# Patient Record
Sex: Male | Born: 1935 | Race: White | Hispanic: No | Marital: Married | Smoking: Never smoker
Health system: Southern US, Community
[De-identification: ages and names within clinical notes are randomized; demographics above are authoritative.]

## PROBLEM LIST (undated history)

## (undated) DIAGNOSIS — R519 Headache, unspecified: Secondary | ICD-10-CM

## (undated) DIAGNOSIS — D1803 Hemangioma of intra-abdominal structures: Secondary | ICD-10-CM

## (undated) DIAGNOSIS — M1990 Unspecified osteoarthritis, unspecified site: Secondary | ICD-10-CM

## (undated) DIAGNOSIS — Z889 Allergy status to unspecified drugs, medicaments and biological substances status: Secondary | ICD-10-CM

## (undated) DIAGNOSIS — M545 Low back pain, unspecified: Secondary | ICD-10-CM

## (undated) DIAGNOSIS — K5790 Diverticulosis of intestine, part unspecified, without perforation or abscess without bleeding: Secondary | ICD-10-CM

## (undated) DIAGNOSIS — E785 Hyperlipidemia, unspecified: Secondary | ICD-10-CM

## (undated) DIAGNOSIS — I10 Essential (primary) hypertension: Secondary | ICD-10-CM

## (undated) DIAGNOSIS — R51 Headache: Secondary | ICD-10-CM

## (undated) HISTORY — PX: COLONOSCOPY: SHX174

---

## 2010-04-30 ENCOUNTER — Encounter: Admission: RE | Admit: 2010-04-30 | Discharge: 2010-04-30 | Payer: Self-pay | Admitting: Internal Medicine

## 2010-08-10 ENCOUNTER — Other Ambulatory Visit: Payer: Self-pay | Admitting: Internal Medicine

## 2010-08-10 DIAGNOSIS — K769 Liver disease, unspecified: Secondary | ICD-10-CM

## 2010-09-13 ENCOUNTER — Ambulatory Visit
Admission: RE | Admit: 2010-09-13 | Discharge: 2010-09-13 | Disposition: A | Discharge status: Home / Self Care | Payer: Medicare Other | Source: Ambulatory Visit | Attending: Internal Medicine | Admitting: Internal Medicine

## 2010-09-13 DIAGNOSIS — K769 Liver disease, unspecified: Secondary | ICD-10-CM

## 2010-09-13 MED ORDER — GADOBENATE DIMEGLUMINE 529 MG/ML IV SOLN
16.0000 mL | Freq: Once | INTRAVENOUS | Status: AC | PRN
  Administered 2010-09-13: 16 mL via INTRAVENOUS

## 2011-08-30 DIAGNOSIS — I10 Essential (primary) hypertension: Secondary | ICD-10-CM | POA: Diagnosis not present

## 2011-08-30 DIAGNOSIS — IMO0001 Reserved for inherently not codable concepts without codable children: Secondary | ICD-10-CM | POA: Diagnosis not present

## 2011-08-30 DIAGNOSIS — R109 Unspecified abdominal pain: Secondary | ICD-10-CM | POA: Diagnosis not present

## 2011-08-30 DIAGNOSIS — M159 Polyosteoarthritis, unspecified: Secondary | ICD-10-CM | POA: Diagnosis not present

## 2011-09-13 DIAGNOSIS — M159 Polyosteoarthritis, unspecified: Secondary | ICD-10-CM | POA: Diagnosis not present

## 2011-09-13 DIAGNOSIS — I10 Essential (primary) hypertension: Secondary | ICD-10-CM | POA: Diagnosis not present

## 2011-09-13 DIAGNOSIS — R109 Unspecified abdominal pain: Secondary | ICD-10-CM | POA: Diagnosis not present

## 2011-10-18 DIAGNOSIS — H2510 Age-related nuclear cataract, unspecified eye: Secondary | ICD-10-CM | POA: Diagnosis not present

## 2011-10-18 DIAGNOSIS — H52209 Unspecified astigmatism, unspecified eye: Secondary | ICD-10-CM | POA: Diagnosis not present

## 2012-01-11 DIAGNOSIS — R1031 Right lower quadrant pain: Secondary | ICD-10-CM | POA: Diagnosis not present

## 2012-01-11 DIAGNOSIS — R1011 Right upper quadrant pain: Secondary | ICD-10-CM | POA: Diagnosis not present

## 2013-08-06 DIAGNOSIS — N139 Obstructive and reflux uropathy, unspecified: Secondary | ICD-10-CM | POA: Diagnosis not present

## 2013-08-06 DIAGNOSIS — N401 Enlarged prostate with lower urinary tract symptoms: Secondary | ICD-10-CM | POA: Diagnosis not present

## 2013-08-06 DIAGNOSIS — R972 Elevated prostate specific antigen [PSA]: Secondary | ICD-10-CM | POA: Diagnosis not present

## 2013-08-13 DIAGNOSIS — R972 Elevated prostate specific antigen [PSA]: Secondary | ICD-10-CM | POA: Diagnosis not present

## 2013-08-13 DIAGNOSIS — N139 Obstructive and reflux uropathy, unspecified: Secondary | ICD-10-CM | POA: Diagnosis not present

## 2013-08-13 DIAGNOSIS — N401 Enlarged prostate with lower urinary tract symptoms: Secondary | ICD-10-CM | POA: Diagnosis not present

## 2013-09-26 DIAGNOSIS — I10 Essential (primary) hypertension: Secondary | ICD-10-CM | POA: Diagnosis not present

## 2013-10-02 DIAGNOSIS — E782 Mixed hyperlipidemia: Secondary | ICD-10-CM | POA: Diagnosis not present

## 2013-10-02 DIAGNOSIS — I10 Essential (primary) hypertension: Secondary | ICD-10-CM | POA: Diagnosis not present

## 2013-12-03 DIAGNOSIS — H53009 Unspecified amblyopia, unspecified eye: Secondary | ICD-10-CM | POA: Diagnosis not present

## 2013-12-03 DIAGNOSIS — H2510 Age-related nuclear cataract, unspecified eye: Secondary | ICD-10-CM | POA: Diagnosis not present

## 2013-12-03 DIAGNOSIS — H52209 Unspecified astigmatism, unspecified eye: Secondary | ICD-10-CM | POA: Diagnosis not present

## 2013-12-26 DIAGNOSIS — M549 Dorsalgia, unspecified: Secondary | ICD-10-CM | POA: Diagnosis not present

## 2013-12-26 DIAGNOSIS — I10 Essential (primary) hypertension: Secondary | ICD-10-CM | POA: Diagnosis not present

## 2013-12-26 DIAGNOSIS — E782 Mixed hyperlipidemia: Secondary | ICD-10-CM | POA: Diagnosis not present

## 2013-12-26 DIAGNOSIS — IMO0002 Reserved for concepts with insufficient information to code with codable children: Secondary | ICD-10-CM | POA: Diagnosis not present

## 2014-05-05 DIAGNOSIS — Z23 Encounter for immunization: Secondary | ICD-10-CM | POA: Diagnosis not present

## 2014-06-19 DIAGNOSIS — R198 Other specified symptoms and signs involving the digestive system and abdomen: Secondary | ICD-10-CM | POA: Diagnosis not present

## 2014-06-19 DIAGNOSIS — R1011 Right upper quadrant pain: Secondary | ICD-10-CM | POA: Diagnosis not present

## 2014-06-19 DIAGNOSIS — K5730 Diverticulosis of large intestine without perforation or abscess without bleeding: Secondary | ICD-10-CM | POA: Diagnosis not present

## 2014-07-21 ENCOUNTER — Other Ambulatory Visit: Payer: Self-pay | Admitting: Gastroenterology

## 2014-07-21 DIAGNOSIS — R141 Gas pain: Secondary | ICD-10-CM | POA: Diagnosis not present

## 2014-07-21 DIAGNOSIS — R1011 Right upper quadrant pain: Secondary | ICD-10-CM

## 2014-07-21 DIAGNOSIS — K5900 Constipation, unspecified: Secondary | ICD-10-CM | POA: Diagnosis not present

## 2014-07-23 DIAGNOSIS — I10 Essential (primary) hypertension: Secondary | ICD-10-CM | POA: Diagnosis not present

## 2014-07-23 DIAGNOSIS — E161 Other hypoglycemia: Secondary | ICD-10-CM | POA: Diagnosis not present

## 2014-07-23 DIAGNOSIS — Z1389 Encounter for screening for other disorder: Secondary | ICD-10-CM | POA: Diagnosis not present

## 2014-07-23 DIAGNOSIS — Z0000 Encounter for general adult medical examination without abnormal findings: Secondary | ICD-10-CM | POA: Diagnosis not present

## 2014-07-30 DIAGNOSIS — M545 Low back pain: Secondary | ICD-10-CM | POA: Diagnosis not present

## 2014-07-30 DIAGNOSIS — E782 Mixed hyperlipidemia: Secondary | ICD-10-CM | POA: Diagnosis not present

## 2014-07-30 DIAGNOSIS — M150 Primary generalized (osteo)arthritis: Secondary | ICD-10-CM | POA: Diagnosis not present

## 2014-07-30 DIAGNOSIS — I10 Essential (primary) hypertension: Secondary | ICD-10-CM | POA: Diagnosis not present

## 2014-08-04 ENCOUNTER — Encounter (HOSPITAL_COMMUNITY)
Admission: RE | Admit: 2014-08-04 | Discharge: 2014-08-04 | Disposition: A | Discharge status: Home / Self Care | Payer: Medicare Other | Source: Ambulatory Visit | Attending: Gastroenterology | Admitting: Gastroenterology

## 2014-08-04 ENCOUNTER — Ambulatory Visit (HOSPITAL_COMMUNITY)
Admission: RE | Admit: 2014-08-04 | Discharge: 2014-08-04 | Disposition: A | Discharge status: Home / Self Care | Payer: Medicare Other | Source: Ambulatory Visit | Attending: Gastroenterology | Admitting: Gastroenterology

## 2014-08-04 DIAGNOSIS — D1803 Hemangioma of intra-abdominal structures: Secondary | ICD-10-CM | POA: Diagnosis not present

## 2014-08-04 DIAGNOSIS — R1011 Right upper quadrant pain: Secondary | ICD-10-CM | POA: Diagnosis not present

## 2014-08-04 DIAGNOSIS — K828 Other specified diseases of gallbladder: Secondary | ICD-10-CM | POA: Diagnosis not present

## 2014-08-04 MED ORDER — SINCALIDE 5 MCG IJ SOLR
INTRAMUSCULAR | Status: AC
  Filled 2014-08-04: qty 5

## 2014-08-04 MED ORDER — TECHNETIUM TC 99M MEBROFENIN IV KIT
5.0000 | PACK | Freq: Once | INTRAVENOUS | Status: AC | PRN
  Administered 2014-08-04: 5 via INTRAVENOUS

## 2014-08-04 MED ORDER — STERILE WATER FOR INJECTION IJ SOLN
INTRAMUSCULAR | Status: AC
  Filled 2014-08-04: qty 10

## 2014-08-04 MED ORDER — SINCALIDE 5 MCG IJ SOLR
0.0200 ug/kg | Freq: Once | INTRAMUSCULAR | Status: AC
  Administered 2014-08-04: 1.6 ug via INTRAVENOUS

## 2014-08-18 DIAGNOSIS — K8020 Calculus of gallbladder without cholecystitis without obstruction: Secondary | ICD-10-CM | POA: Diagnosis not present

## 2014-08-18 DIAGNOSIS — R140 Abdominal distension (gaseous): Secondary | ICD-10-CM | POA: Diagnosis not present

## 2014-08-21 DIAGNOSIS — K8050 Calculus of bile duct without cholangitis or cholecystitis without obstruction: Secondary | ICD-10-CM | POA: Diagnosis not present

## 2014-10-09 NOTE — Progress Notes (Signed)
ekg 07-30-14 dr Carmie End on chart

## 2014-10-09 NOTE — Progress Notes (Signed)
Please put orders in Epic surgery 10-13-14 pre op 10-10-14 Thanks

## 2014-10-09 NOTE — Patient Instructions (Addendum)
Torion Hulgan  10/09/2014   Your procedure is scheduled on: Monday October 13, 2014  Report to [facility] Main  Entrance and follow signs to               [place] at 1130 AM.  Call this number if you have problems the morning of surgery [phone]   Remember:  Do not eat food  :After Midnight Sunday night, may have clear liquids from mdfnight Sunday night until  730 am Monday morning, nothing by mouth after 730 am Monday morning.   Take these medicines the morning of surgery with A SIP OF WATER: none                               You may not have any metal on your body including hair pins and              piercings  Do not wear jewelry, make-up, lotions, powders or perfumes.             Do not wear nail polish.  Do not shave  48 hours prior to surgery.              Men may shave face and neck.   Do not bring valuables to the hospital. [place].  Contacts, dentures or bridgework may not be worn into surgery.  Leave suitcase in the car. After surgery it may be brought to your room.     Patients discharged the day of surgery will not be allowed to drive home.  Name and phone number of your driver: wife SUSAN  Special Instructions: N/A              Please read over the following fact sheets you were given: _____________________________________________________________________             [facility] - Preparing for Surgery Before surgery, you can play an important role.  Because skin is not sterile, your skin needs to be as free of germs as possible.  You can reduce the number of germs on your skin by washing with CHG (chlorahexidine gluconate) soap before surgery.  CHG is an antiseptic cleaner which kills germs and bonds with the skin to continue killing germs even after washing. Please DO NOT use if you have an allergy to CHG or antibacterial soaps.  If your skin becomes reddened/irritated stop using  the CHG and inform your nurse when you arrive at Short Stay. Do not shave (including legs and underarms) for at least 48 hours prior to the first CHG shower.  You may shave your face/neck. Please follow these instructions carefully:  1.  Shower with CHG Soap the night before surgery and the  morning of Surgery.  2.  If you choose to wash your hair, wash your hair first as usual with your  normal  shampoo.  3.  After you shampoo, rinse your hair and body thoroughly to remove the  shampoo.                           4.  Use CHG as you would any other liquid soap.  You can apply chg directly  to the skin and wash  Gently with a scrungie or clean washcloth.  5.  Apply the CHG Soap to your body ONLY FROM THE NECK DOWN.   Do not use on face/ open                           Wound or open sores. Avoid contact with eyes, ears mouth and genitals (private parts).                       Wash face,  Genitals (private parts) with your normal soap.             6.  Wash thoroughly, paying special attention to the area where your surgery  will be performed.  7.  Thoroughly rinse your body with warm water from the neck down.  8.  DO NOT shower/wash with your normal soap after using and rinsing off  the CHG Soap.                9.  Pat yourself dry with a clean towel.            10.  Wear clean pajamas.            11.  Place clean sheets on your bed the night of your first shower and do not  sleep with pets. Day of Surgery : Do not apply any lotions/deodorants the morning of surgery.  Please wear clean clothes to the hospital/surgery center.  FAILURE TO FOLLOW THESE INSTRUCTIONS MAY RESULT IN THE CANCELLATION OF YOUR SURGERY PATIENT SIGNATURE_________________________________  NURSE SIGNATURE__________________________________  ________________________________________________________________________    CLEAR LIQUID DIET   Foods Allowed                                                                      Foods Excluded  Coffee and tea, regular and decaf                             liquids that you cannot  Plain Jell-O in any flavor                                             see through such as: Fruit ices (not with fruit pulp)                                     milk, soups, orange juice  Iced Popsicles                                    All solid food Carbonated beverages, regular and diet                                    Cranberry, grape and apple juices Sports drinks like Gatorade Lightly seasoned clear broth or consume(fat free) Sugar, honey syrup  Sample Menu Breakfast                                Lunch                                     Supper Cranberry juice                    Beef broth                            Chicken broth Jell-O                                     Grape juice                           Apple juice Coffee or tea                        Jell-O                                      Popsicle                                                Coffee or tea                        Coffee or tea  _____________________________________________________________________

## 2014-10-10 ENCOUNTER — Encounter (HOSPITAL_COMMUNITY)
Admission: RE | Admit: 2014-10-10 | Discharge: 2014-10-10 | Disposition: A | Discharge status: Home / Self Care | Payer: Medicare Other | Source: Ambulatory Visit | Attending: General Surgery | Admitting: General Surgery

## 2014-10-10 ENCOUNTER — Encounter (HOSPITAL_COMMUNITY): Payer: Self-pay

## 2014-10-10 DIAGNOSIS — G43909 Migraine, unspecified, not intractable, without status migrainosus: Secondary | ICD-10-CM | POA: Diagnosis not present

## 2014-10-10 DIAGNOSIS — F1099 Alcohol use, unspecified with unspecified alcohol-induced disorder: Secondary | ICD-10-CM | POA: Diagnosis not present

## 2014-10-10 DIAGNOSIS — K811 Chronic cholecystitis: Secondary | ICD-10-CM | POA: Diagnosis not present

## 2014-10-10 DIAGNOSIS — R109 Unspecified abdominal pain: Secondary | ICD-10-CM | POA: Diagnosis present

## 2014-10-10 DIAGNOSIS — I10 Essential (primary) hypertension: Secondary | ICD-10-CM | POA: Diagnosis not present

## 2014-10-10 HISTORY — DX: Low back pain, unspecified: M54.50

## 2014-10-10 HISTORY — DX: Hyperlipidemia, unspecified: E78.5

## 2014-10-10 HISTORY — DX: Headache, unspecified: R51.9

## 2014-10-10 HISTORY — DX: Hemangioma of intra-abdominal structures: D18.03

## 2014-10-10 HISTORY — DX: Headache: R51

## 2014-10-10 HISTORY — DX: Low back pain: M54.5

## 2014-10-10 HISTORY — DX: Diverticulosis of intestine, part unspecified, without perforation or abscess without bleeding: K57.90

## 2014-10-10 HISTORY — DX: Unspecified osteoarthritis, unspecified site: M19.90

## 2014-10-10 HISTORY — DX: Essential (primary) hypertension: I10

## 2014-10-10 LAB — BASIC METABOLIC PANEL WITH GFR
Anion gap: 8 (ref 5–15)
BUN: 38 mg/dL — ABNORMAL HIGH (ref 6–23)
CO2: 27 mmol/L (ref 19–32)
Calcium: 9 mg/dL (ref 8.4–10.5)
Chloride: 105 mmol/L (ref 96–112)
Creatinine, Ser: 0.79 mg/dL (ref 0.50–1.35)
GFR calc Af Amer: >90 mL/min
GFR calc non Af Amer: 84 mL/min — ABNORMAL LOW
Glucose, Bld: 108 mg/dL — ABNORMAL HIGH (ref 70–99)
Potassium: 4.4 mmol/L (ref 3.5–5.1)
Sodium: 140 mmol/L (ref 135–145)

## 2014-10-10 LAB — CBC
HCT: 41.2 % (ref 39.0–52.0)
Hemoglobin: 13.3 g/dL (ref 13.0–17.0)
MCH: 31.7 pg (ref 26.0–34.0)
MCHC: 32.3 g/dL (ref 30.0–36.0)
MCV: 98.3 fL (ref 78.0–100.0)
Platelets: 194 K/uL (ref 150–400)
RBC: 4.19 MIL/uL — ABNORMAL LOW (ref 4.22–5.81)
RDW: 13 % (ref 11.5–15.5)
WBC: 5.3 K/uL (ref 4.0–10.5)

## 2014-10-10 NOTE — Progress Notes (Signed)
Faxed bun to dr Redmond Pulling via epic

## 2014-10-10 NOTE — Progress Notes (Signed)
lov dr Ashby Dawes 1/16 chart

## 2014-10-13 ENCOUNTER — Ambulatory Visit: Payer: Self-pay | Admitting: General Surgery

## 2014-10-13 ENCOUNTER — Ambulatory Visit (HOSPITAL_COMMUNITY): Payer: Medicare Other | Admitting: Anesthesiology

## 2014-10-13 ENCOUNTER — Ambulatory Visit (HOSPITAL_COMMUNITY)
Admission: RE | Admit: 2014-10-13 | Discharge: 2014-10-13 | Disposition: A | Discharge status: Home / Self Care | Payer: Medicare Other | Source: Ambulatory Visit | Attending: General Surgery | Admitting: General Surgery

## 2014-10-13 ENCOUNTER — Encounter (HOSPITAL_COMMUNITY): Payer: Self-pay

## 2014-10-13 ENCOUNTER — Ambulatory Visit (HOSPITAL_COMMUNITY): Payer: Medicare Other

## 2014-10-13 ENCOUNTER — Encounter (HOSPITAL_COMMUNITY)
Admission: RE | Disposition: A | Discharge status: Home / Self Care | Payer: Self-pay | Source: Ambulatory Visit | Attending: General Surgery

## 2014-10-13 DIAGNOSIS — K8020 Calculus of gallbladder without cholecystitis without obstruction: Secondary | ICD-10-CM

## 2014-10-13 DIAGNOSIS — F1099 Alcohol use, unspecified with unspecified alcohol-induced disorder: Secondary | ICD-10-CM | POA: Insufficient documentation

## 2014-10-13 DIAGNOSIS — K811 Chronic cholecystitis: Secondary | ICD-10-CM | POA: Insufficient documentation

## 2014-10-13 DIAGNOSIS — M545 Low back pain: Secondary | ICD-10-CM | POA: Diagnosis not present

## 2014-10-13 DIAGNOSIS — G43909 Migraine, unspecified, not intractable, without status migrainosus: Secondary | ICD-10-CM | POA: Insufficient documentation

## 2014-10-13 DIAGNOSIS — I10 Essential (primary) hypertension: Secondary | ICD-10-CM | POA: Insufficient documentation

## 2014-10-13 DIAGNOSIS — K8050 Calculus of bile duct without cholangitis or cholecystitis without obstruction: Secondary | ICD-10-CM | POA: Diagnosis not present

## 2014-10-13 DIAGNOSIS — M1990 Unspecified osteoarthritis, unspecified site: Secondary | ICD-10-CM | POA: Diagnosis not present

## 2014-10-13 HISTORY — PX: CHOLECYSTECTOMY: SHX55

## 2014-10-13 SURGERY — LAPAROSCOPIC CHOLECYSTECTOMY WITH INTRAOPERATIVE CHOLANGIOGRAM
Anesthesia: General | Site: Abdomen

## 2014-10-13 MED ORDER — DEXAMETHASONE SODIUM PHOSPHATE 10 MG/ML IJ SOLN
INTRAMUSCULAR | Status: AC
Start: 2014-10-13 — End: 2014-10-13
  Filled 2014-10-13: qty 1

## 2014-10-13 MED ORDER — DEXTROSE 5 % IV SOLN
INTRAVENOUS | Status: AC
  Filled 2014-10-13: qty 2

## 2014-10-13 MED ORDER — PROPOFOL 10 MG/ML IV BOLUS
INTRAVENOUS | Status: AC
  Filled 2014-10-13: qty 20

## 2014-10-13 MED ORDER — LACTATED RINGERS IV SOLN: INTRAVENOUS | Status: DC

## 2014-10-13 MED ORDER — PROPOFOL 10 MG/ML IV BOLUS
INTRAVENOUS | Status: DC | PRN
  Administered 2014-10-13: 150 mg via INTRAVENOUS

## 2014-10-13 MED ORDER — FENTANYL CITRATE 0.05 MG/ML IJ SOLN
INTRAMUSCULAR | Status: DC | PRN
  Administered 2014-10-13 (×5): 50 ug via INTRAVENOUS

## 2014-10-13 MED ORDER — BUPIVACAINE-EPINEPHRINE 0.25% -1:200000 IJ SOLN
INTRAMUSCULAR | Status: AC
  Filled 2014-10-13: qty 1

## 2014-10-13 MED ORDER — SODIUM CHLORIDE 0.9 % IJ SOLN: 3.0000 mL | Freq: Two times a day (BID) | INTRAMUSCULAR | Status: DC

## 2014-10-13 MED ORDER — OXYCODONE-ACETAMINOPHEN 5-325 MG PO TABS: 1.0000 | ORAL_TABLET | ORAL | Status: AC | PRN

## 2014-10-13 MED ORDER — GLYCOPYRROLATE 0.2 MG/ML IJ SOLN
INTRAMUSCULAR | Status: AC
  Filled 2014-10-13: qty 3

## 2014-10-13 MED ORDER — DEXTROSE 5 % IV SOLN
2.0000 g | INTRAVENOUS | Status: AC
  Administered 2014-10-13: 2 g via INTRAVENOUS

## 2014-10-13 MED ORDER — MORPHINE SULFATE 10 MG/ML IJ SOLN: 1.0000 mg | INTRAMUSCULAR | Status: DC | PRN

## 2014-10-13 MED ORDER — ACETAMINOPHEN 650 MG RE SUPP
650.0000 mg | RECTAL | Status: DC | PRN
  Filled 2014-10-13: qty 1

## 2014-10-13 MED ORDER — NEOSTIGMINE METHYLSULFATE 10 MG/10ML IV SOLN
INTRAVENOUS | Status: AC
  Filled 2014-10-13: qty 1

## 2014-10-13 MED ORDER — SODIUM CHLORIDE 0.9 % IJ SOLN: 3.0000 mL | INTRAMUSCULAR | Status: DC | PRN

## 2014-10-13 MED ORDER — HEMOSTATIC AGENTS (NO CHARGE) OPTIME
TOPICAL | Status: DC | PRN
  Administered 2014-10-13: 1 via TOPICAL

## 2014-10-13 MED ORDER — FENTANYL CITRATE 0.05 MG/ML IJ SOLN
INTRAMUSCULAR | Status: AC
  Filled 2014-10-13: qty 5

## 2014-10-13 MED ORDER — SODIUM CHLORIDE 0.9 % IV SOLN: 250.0000 mL | INTRAVENOUS | Status: DC | PRN

## 2014-10-13 MED ORDER — HYDROMORPHONE HCL 1 MG/ML IJ SOLN
INTRAMUSCULAR | Status: AC
  Filled 2014-10-13: qty 1

## 2014-10-13 MED ORDER — DEXAMETHASONE SODIUM PHOSPHATE 10 MG/ML IJ SOLN
INTRAMUSCULAR | Status: DC | PRN
  Administered 2014-10-13: 10 mg via INTRAVENOUS

## 2014-10-13 MED ORDER — POTASSIUM CHLORIDE IN NACL 20-0.9 MEQ/L-% IV SOLN: INTRAVENOUS | Status: DC

## 2014-10-13 MED ORDER — HYDROMORPHONE HCL 1 MG/ML IJ SOLN
0.2500 mg | INTRAMUSCULAR | Status: DC | PRN
  Administered 2014-10-13 (×2): 0.5 mg via INTRAVENOUS

## 2014-10-13 MED ORDER — GLYCOPYRROLATE 0.2 MG/ML IJ SOLN
INTRAMUSCULAR | Status: DC | PRN
  Administered 2014-10-13: 0.6 mg via INTRAVENOUS

## 2014-10-13 MED ORDER — CISATRACURIUM BESYLATE (PF) 10 MG/5ML IV SOLN
INTRAVENOUS | Status: DC | PRN
  Administered 2014-10-13: 6 mg via INTRAVENOUS

## 2014-10-13 MED ORDER — LACTATED RINGERS IV SOLN
INTRAVENOUS | Status: DC
  Administered 2014-10-13: 1000 mL via INTRAVENOUS

## 2014-10-13 MED ORDER — ACETAMINOPHEN 325 MG PO TABS: 650.0000 mg | ORAL_TABLET | ORAL | Status: DC | PRN

## 2014-10-13 MED ORDER — ONDANSETRON HCL 4 MG/2ML IJ SOLN
INTRAMUSCULAR | Status: DC | PRN
  Administered 2014-10-13: 4 mg via INTRAVENOUS

## 2014-10-13 MED ORDER — DIATRIZOATE MEGLUMINE 30 % UR SOLN
URETHRAL | Status: DC | PRN
  Administered 2014-10-13: 5 mL via URETHRAL

## 2014-10-13 MED ORDER — OXYCODONE HCL 5 MG PO TABS
5.0000 mg | ORAL_TABLET | ORAL | Status: DC | PRN
  Administered 2014-10-13: 5 mg via ORAL
  Filled 2014-10-13: qty 1

## 2014-10-13 MED ORDER — LACTATED RINGERS IV SOLN
INTRAVENOUS | Status: DC | PRN
  Administered 2014-10-13: 1000 mL via INTRAVENOUS

## 2014-10-13 MED ORDER — NEOSTIGMINE METHYLSULFATE 10 MG/10ML IV SOLN
INTRAVENOUS | Status: DC | PRN
  Administered 2014-10-13: 4 mg via INTRAVENOUS

## 2014-10-13 MED ORDER — ONDANSETRON HCL 4 MG/2ML IJ SOLN
INTRAMUSCULAR | Status: AC
  Filled 2014-10-13: qty 2

## 2014-10-13 MED ORDER — SUCCINYLCHOLINE CHLORIDE 20 MG/ML IJ SOLN
INTRAMUSCULAR | Status: DC | PRN
  Administered 2014-10-13: 100 mg via INTRAVENOUS

## 2014-10-13 SURGICAL SUPPLY — 36 items
APPLIER CLIP 5 13 M/L LIGAMAX5 (MISCELLANEOUS) ×2
APPLIER CLIP ROT 10 11.4 M/L (STAPLE)
CABLE HIGH FREQUENCY MONO STRZ (ELECTRODE) ×2 IMPLANT
CHLORAPREP W/TINT 26ML (MISCELLANEOUS) ×2 IMPLANT
CLIP APPLIE 5 13 M/L LIGAMAX5 (MISCELLANEOUS) ×1 IMPLANT
CLIP APPLIE ROT 10 11.4 M/L (STAPLE) IMPLANT
COVER MAYO STAND STRL (DRAPES) ×2 IMPLANT
DECANTER SPIKE VIAL GLASS SM (MISCELLANEOUS) ×2 IMPLANT
DRAPE C-ARM 42X120 X-RAY (DRAPES) IMPLANT
DRAPE LAPAROSCOPIC ABDOMINAL (DRAPES) ×2 IMPLANT
ELECT REM PT RETURN 9FT ADLT (ELECTROSURGICAL) ×2
ELECTRODE REM PT RTRN 9FT ADLT (ELECTROSURGICAL) ×1 IMPLANT
GLOVE BIOGEL M STRL SZ7.5 (GLOVE) ×4 IMPLANT
GLOVE BIOGEL PI IND STRL 7.0 (GLOVE) ×3 IMPLANT
GLOVE BIOGEL PI INDICATOR 7.0 (GLOVE) ×3
GOWN STRL REUS W/TWL XL LVL3 (GOWN DISPOSABLE) ×6 IMPLANT
HEMOSTAT SNOW SURGICEL 2X4 (HEMOSTASIS) ×2 IMPLANT
KIT BASIN OR (CUSTOM PROCEDURE TRAY) ×2 IMPLANT
LIQUID BAND (GAUZE/BANDAGES/DRESSINGS) ×2 IMPLANT
NS IRRIG 1000ML POUR BTL (IV SOLUTION) ×2 IMPLANT
PEN SKIN MARKING BROAD (MISCELLANEOUS) ×2 IMPLANT
POUCH RETRIEVAL ECOSAC 10 (ENDOMECHANICALS) IMPLANT
POUCH RETRIEVAL ECOSAC 10MM (ENDOMECHANICALS)
POUCH SPECIMEN RETRIEVAL 10MM (ENDOMECHANICALS) ×2 IMPLANT
SCISSORS LAP 5X35 DISP (ENDOMECHANICALS) ×2 IMPLANT
SET CHOLANGIOGRAPH MIX (MISCELLANEOUS) IMPLANT
SET IRRIG TUBING LAPAROSCOPIC (IRRIGATION / IRRIGATOR) ×2 IMPLANT
SLEEVE XCEL OPT CAN 5 100 (ENDOMECHANICALS) ×4 IMPLANT
SUT MNCRL AB 4-0 PS2 18 (SUTURE) ×2 IMPLANT
SUT VICRYL 0 UR6 27IN ABS (SUTURE) IMPLANT
TOWEL OR 17X26 10 PK STRL BLUE (TOWEL DISPOSABLE) ×2 IMPLANT
TOWEL OR NON WOVEN STRL DISP B (DISPOSABLE) ×2 IMPLANT
TRAY LAPAROSCOPIC (CUSTOM PROCEDURE TRAY) ×2 IMPLANT
TROCAR BLADELESS OPT 5 100 (ENDOMECHANICALS) ×2 IMPLANT
TROCAR XCEL BLUNT TIP 100MML (ENDOMECHANICALS) ×2 IMPLANT
TROCAR XCEL NON-BLD 11X100MML (ENDOMECHANICALS) ×2 IMPLANT

## 2014-10-13 NOTE — Anesthesia Procedure Notes (Signed)
Procedure Name: Intubation Date/Time: 10/13/2014 12:26 PM Performed by: Dione Booze Pre-anesthesia Checklist: Emergency Drugs available, Patient identified and Suction available Patient Re-evaluated:Patient Re-evaluated prior to inductionOxygen Delivery Method: Circle system utilized Preoxygenation: Pre-oxygenation with 100% oxygen Intubation Type: IV induction Laryngoscope Size: Mac and 4 Grade View: Grade II Tube type: Oral Tube size: 7.5 mm Number of attempts: 1 Airway Equipment and Method: Stylet Placement Confirmation: ETT inserted through vocal cords under direct vision,  breath sounds checked- equal and bilateral and positive ETCO2 Secured at: 21 cm Tube secured with: Tape Dental Injury: Teeth and Oropharynx as per pre-operative assessment

## 2014-10-13 NOTE — Op Note (Signed)
Raymond Holland [id number] 1933/12/15 10/13/2014  Laparoscopic Cholecystectomy with IOC Procedure Note  Indications: This patient presents with biliary colic and will undergo laparoscopic cholecystectomy.  Pre-operative Diagnosis: biliary dyskinesia  Post-operative Diagnosis: Same  Surgeon: [name]   Assistants: none  Anesthesia: General endotracheal anesthesia  ASA Class: 2  Procedure Details  The patient was seen again in the Holding Room. The risks, benefits, complications, treatment options, and expected outcomes were discussed with the patient. The possibilities of reaction to medication, pulmonary aspiration, perforation of viscus, bleeding, recurrent infection, finding a normal gallbladder, the need for additional procedures, failure to diagnose a condition, the possible need to convert to an open procedure, and creating a complication requiring transfusion or operation were discussed with the patient. The likelihood of improving the patient's symptoms with return to their baseline status is good.  The patient and/or family concurred with the proposed plan, giving informed consent. The site of surgery properly noted. The patient was taken to Operating Room, identified as Raymond Holland and the procedure verified as Laparoscopic Cholecystectomy with Intraoperative Cholangiogram. A Time Out was held and the above information confirmed. Antibiotic prophylaxis was administered.   Prior to the induction of general anesthesia, antibiotic prophylaxis was administered. General endotracheal anesthesia was then administered and tolerated well. After the induction, the abdomen was prepped with Chloraprep and draped in the sterile fashion. The patient was positioned in the supine position.  Local anesthetic agent was injected into the skin near the umbilicus and an incision made. We dissected down to the abdominal fascia with blunt dissection.  The fascia was incised vertically and we entered  the peritoneal cavity bluntly.  A pursestring suture of 0-Vicryl was placed around the fascial opening.  The Hasson cannula was inserted and secured with the stay suture.  Pneumoperitoneum was then created with CO2 and tolerated well without any adverse changes in the patient's vital signs. An 5-mm port was placed in the subxiphoid position.  Two 5-mm ports were placed in the right upper quadrant. All skin incisions were infiltrated with a local anesthetic agent before making the incision and placing the trocars.   We positioned the patient in reverse Trendelenburg, tilted slightly to the patient's left.  The gallbladder was identified, the fundus grasped and retracted cephalad. Adhesions were lysed bluntly and with the electrocautery where indicated, taking care not to injure any adjacent organs or viscus. The infundibulum was grasped and retracted laterally, exposing the peritoneum overlying the triangle of Calot. This was then divided and exposed in a blunt fashion. A critical view of the cystic duct and cystic artery was obtained.  The cystic duct was clearly identified and bluntly dissected circumferentially. The cystic duct was ligated with a clip distally.   An incision was made in the cystic duct and the [facility] cholangiogram catheter introduced. The catheter was secured using a clip. A cholangiogram was then obtained which showed good visualization of the distal and proximal biliary tree with no sign of filling defects or obstruction.  Contrast flowed easily into the duodenum. The catheter was then removed.   The cystic duct was then ligated with clips and divided. The cystic artery which had been identified and dissected free was ligated with clips and divided as well.   The gallbladder was dissected from the liver bed in retrograde fashion with the electrocautery. There was spillage from the gallbladder. The gallbladder was removed and placed in an Endocatch sac.  The gallbladder and Endocatch sac were  then removed through the umbilical  port site. The liver bed was irrigated and inspected. Hemostasis was achieved with the electrocautery. There was 1 spot where I had to re-coagulate and hemostasis was achieved but I decided to place a piece of surgical snow in the liver bed. Copious irrigation was utilized and was repeatedly aspirated until clear.  The pursestring suture was used to close the umbilical fascia.    We again inspected the right upper quadrant for hemostasis.  The umbilical closure was inspected and there was no air leak and nothing trapped within the closure. Pneumoperitoneum was released as we removed the trocars.  4-0 Monocryl was used to close the skin.   Liquid band exceed was applied. The patient was then extubated and brought to the recovery room in stable condition. Instrument, sponge, and needle counts were correct at closure and at the conclusion of the case.   Findings: Distended gallbladder; normal IOC, +SNoW  Estimated Blood Loss: Minimal         Drains: none         Specimens: Gallbladder           Complications: None; patient tolerated the procedure well.         Disposition: PACU - hemodynamically stable.         Condition: stable  Leighton Ruff. Redmond Pulling, MD, FACS General, Bariatric, & Minimally Invasive Surgery [facility], [place]

## 2014-10-13 NOTE — Interval H&P Note (Signed)
History and Physical Interval Note:  10/13/2014 11:45 AM  Raymond Holland  has presented today for surgery, with the diagnosis of Biliary Colic  The various methods of treatment have been discussed with the patient and family. After consideration of risks, benefits and other options for treatment, the patient has consented to  Procedure(s): LAPAROSCOPIC CHOLECYSTECTOMY WITH INTRAOPERATIVE CHOLANGIOGRAM (N/A) as a surgical intervention .  The patient's history has been reviewed, patient examined, no change in status, stable for surgery.  I have reviewed the patient's chart and labs.  Questions were answered to the patient's satisfaction.    Leighton Ruff. Redmond Pulling, MD, [place], Bariatric, & Minimally Invasive Surgery [facility], [place]  [facility]

## 2014-10-13 NOTE — Anesthesia Postprocedure Evaluation (Signed)
  Anesthesia Post-op Note  Patient: Raymond Holland  Procedure(s) Performed: Procedure(s) (LRB): LAPAROSCOPIC CHOLECYSTECTOMY WITH INTRAOPERATIVE CHOLANGIOGRAM (N/A)  Patient Location: PACU  Anesthesia Type: General  Level of Consciousness: awake and alert   Airway and Oxygen Therapy: Patient Spontanous Breathing  Post-op Pain: mild  Post-op Assessment: Post-op Vital signs reviewed, Patient's Cardiovascular Status Stable, Respiratory Function Stable, Patent Airway and No signs of Nausea or vomiting  Last Vitals:  Filed Vitals:   10/13/14 1546  BP: 138/64  Pulse: 61  Temp: 36.3 C  Resp: 16    Post-op Vital Signs: stable   Complications: No apparent anesthesia complications

## 2014-10-13 NOTE — Transfer of Care (Signed)
Immediate Anesthesia Transfer of Care Note  Patient: Raymond Holland  Procedure(s) Performed: Procedure(s): LAPAROSCOPIC CHOLECYSTECTOMY WITH INTRAOPERATIVE CHOLANGIOGRAM (N/A)  Patient Location: PACU  Anesthesia Type:General  Level of Consciousness: awake, alert , oriented and patient cooperative  Airway & Oxygen Therapy: Patient Spontanous Breathing and Patient connected to face mask oxygen  Post-op Assessment: Report given to RN and Post -op Vital signs reviewed and stable  Post vital signs: Reviewed and stable  Last Vitals:  Filed Vitals:   10/13/14 1102  BP: 144/72  Pulse: 59  Temp: 36.4 C  Resp: 18    Complications: No apparent anesthesia complications

## 2014-10-13 NOTE — Anesthesia Preprocedure Evaluation (Addendum)
Anesthesia Evaluation  Patient identified by MRN, date of birth, ID band Patient awake    Reviewed: Allergy & Precautions, H&P , NPO status , Patient's Chart, lab work & pertinent test results  Airway Mallampati: II  TM Distance: >3 FB Neck ROM: full    Dental no notable dental hx. (+) Teeth Intact, Dental Advisory Given   Pulmonary neg pulmonary ROS,  breath sounds clear to auscultation  Pulmonary exam normal       Cardiovascular Exercise Tolerance: Good hypertension, Pt. on medications Rhythm:regular Rate:Normal     Neuro/Psych negative neurological ROS  negative psych ROS   GI/Hepatic negative GI ROS, Neg liver ROS, Liver hemangioma   Endo/Other  negative endocrine ROS  Renal/GU negative Renal ROS  negative genitourinary   Musculoskeletal   Abdominal   Peds  Hematology negative hematology ROS (+)   Anesthesia Other Findings   Reproductive/Obstetrics negative OB ROS                            Anesthesia Physical Anesthesia Plan  ASA: II  Anesthesia Plan: General   Post-op Pain Management:    Induction: Intravenous  Airway Management Planned: Oral ETT  Additional Equipment:   Intra-op Plan:   Post-operative Plan: Extubation in OR  Informed Consent: I have reviewed the patients History and Physical, chart, labs and discussed the procedure including the risks, benefits and alternatives for the proposed anesthesia with the patient or authorized representative who has indicated his/her understanding and acceptance.   Dental Advisory Given  Plan Discussed with: CRNA and Surgeon  Anesthesia Plan Comments:         Anesthesia Quick Evaluation

## 2014-10-13 NOTE — Discharge Instructions (Signed)
[facility] LAPAROSCOPIC [facility]GERY: POST OP INSTRUCTIONS Always review your discharge instruction sheet given to you by the facility where your surgery was performed. IF YOU HAVE DISABILITY OR FAMILY LEAVE FORMS, YOU MUST BRING THEM TO THE OFFICE FOR PROCESSING.   DO NOT GIVE THEM TO YOUR DOCTOR.  1. A prescription for pain medication may be given to you upon discharge.  Take your pain medication as prescribed, if needed.  If narcotic pain medicine is not needed, then you may take acetaminophen (Tylenol) or ibuprofen (Advil) as needed. 2. Take your usually prescribed medications unless otherwise directed. 3. If you need a refill on your pain medication, please contact your pharmacy.  They will contact our office to request authorization. Prescriptions will not be filled after 5pm or on week-ends. 4. You should follow a light diet the first few days after arrival home, such as soup and crackers, etc.  Be sure to include lots of fluids daily. 5. Most patients will experience some swelling and bruising in the area of the incisions.  Ice packs will help.  Swelling and bruising can take several days to resolve.  6. It is common to experience some constipation if taking pain medication after surgery.  Increasing fluid intake and taking a stool softener (such as Colace) will usually help or prevent this problem from occurring.  A mild laxative (Milk of Magnesia or Miralax) should be taken according to package instructions if there are no bowel movements after 48 hours. 7. If your surgeon used skin glue on the incision, you may shower in 24 hours.  The glue will flake off over the next 2-3 weeks.  Any sutures or staples will be removed at the office during your follow-up visit. 8. ACTIVITIES:  You may resume regular (light) daily activities beginning the next day--such as daily self-care, walking, climbing stairs--gradually increasing activities as tolerated.  You may have sexual  intercourse when it is comfortable.  Refrain from any heavy lifting or straining until approved by your doctor. a. You may drive when you are no longer taking prescription pain medication, you can comfortably wear a seatbelt, and you can safely maneuver your car and apply brakes. 9. You should see your doctor in the office for a follow-up appointment approximately 2-3 weeks after your surgery.  Make sure that you call for this appointment within a day or two after you arrive home to insure a convenient appointment time. 10. OTHER INSTRUCTIONS:  WHEN TO CALL YOUR DOCTOR: 1. Fever over 101.0 2. Inability to urinate 3. Continued bleeding from incision. 4. Increased pain, redness, or drainage from the incision. 5. Increasing abdominal pain  The clinic staff is available to answer your questions during regular business hours.  Please dont hesitate to call and ask to speak to one of the nurses for clinical concerns.  If you have a medical emergency, go to the nearest emergency room or call 911.  A surgeon from [facility] is always on call at the hospital. [address] [place], [place], [place]  [address] ? [address], [place], [place]   [address] [phone] ? [phone] ? FAX [phone] Web site: [email or web address]    General Anesthesia, Care After Refer to this sheet in the next few weeks. These instructions provide you with information on caring for yourself after your procedure. Your health care provider may also give you more specific instructions. Your treatment has been planned according to current medical practices, but problems sometimes occur.  Call your health care provider if you have any problems or questions after your procedure. WHAT TO EXPECT AFTER THE PROCEDURE After the procedure, it is typical to experience:  Sleepiness.  Nausea and vomiting. HOME CARE INSTRUCTIONS  For the first 24 hours after general anesthesia:  Have a  responsible person with you.  Do not drive a car. If you are alone, do not take public transportation.  Do not drink alcohol.  Do not take medicine that has not been prescribed by your health care provider.  Do not sign important papers or make important decisions.  You may resume a normal diet and activities as directed by your health care provider.  Change bandages (dressings) as directed.  If you have questions or problems that seem related to general anesthesia, call the hospital and ask for the anesthetist or anesthesiologist on call. SEEK MEDICAL CARE IF:  You have nausea and vomiting that continue the day after anesthesia.  You develop a rash. SEEK IMMEDIATE MEDICAL CARE IF:   You have difficulty breathing.  You have chest pain.  You have any allergic problems. Document Released: 10/03/2000 Document Revised: 07/02/2013 Document Reviewed: 01/10/2013 [facility] Patient Information 2015 [place], [place]. This information is not intended to replace advice given to you by your health care provider. Make sure you discuss any questions you have with your health care provider.

## 2014-10-13 NOTE — H&P (Signed)
Raymond Holland 08/21/2014 2:15 PM Location: [facility] Patient #: [id number] DOB: 18-Apr-1934 Married / Language: Raymond Holland / Race: White Male  History of Present Illness Raymond Holland M. Raymond Alleyne MD; 08/31/2014 11:59 PM) Patient words: eval GB.  The patient is a 81 year old male who presents for evaluation of gallbladder disease. He is referred by Dr Benson Norway for evaluation of gallbladder disease. He reports a several year history of right sided/upper abdominal discomfort. It is intermittent in nature and more noticeable at night. It is associated with bloating and lasts several hours. He has undergone a CT in the past which was negative and an unremarkable colonoscopy except for diverticula. He denies any jaundice or weight loss. He denies f/c/v/diarrhea/constipation. He underwent a HIDA on 1/25 which was normal except for the fact that he experienced symptoms with CCK injection. An abdominal u/s showed GB sludge and a stable right liver hemiangioma.  cardiac ROS negative   Other Problems Raymond Curry, MD; 09/01/2014 12:00 AM) Arthritis Back Pain Bladder Problems Enlarged Prostate Hemorrhoids High blood pressure Migraine Headache BILIARY COLIC (814.48  J85.63)  Past Surgical History Raymond Buffy, RN; 08/21/2014 2:30 PM) Oral Surgery  Diagnostic Studies History Raymond Buffy, RN; 08/21/2014 2:30 PM) Colonoscopy 1-5 years ago  Allergies Raymond Buffy, RN; 08/21/2014 2:35 PM) No Known Drug Allergies02/05/2015  Medication History Raymond Buffy, RN; 08/21/2014 2:36 PM) Pravastatin Sodium (40MG  Tablet, Oral daily) Active. Lisinopril (40MG  Tablet, Oral daily) Active. Aspirin Childrens (81MG  Tablet Chewable, Oral daily) Active. Multivitamins (Oral daily) Active.  Social History Raymond Buffy, RN; 08/21/2014 2:30 PM) Alcohol use Moderate alcohol use. Caffeine use Tea. No drug use Tobacco use Never smoker.  Family History Raymond Buffy, RN;  08/21/2014 2:30 PM) Alcohol Abuse Sister. Ovarian Cancer Mother.  Review of Systems Raymond Shan Moffitt RN; 08/21/2014 2:30 PM) General Not Present- Appetite Loss, Chills, Fatigue, Fever, Night Sweats, Weight Gain and Weight Loss. Skin Not Present- Change in Wart/Mole, Dryness, Hives, Jaundice, New Lesions, Non-Healing Wounds, Rash and Ulcer. HEENT Present- Hearing Loss, Ringing in the Ears and Wears glasses/contact lenses. Not Present- Earache, Hoarseness, Nose Bleed, Oral Ulcers, Seasonal Allergies, Sinus Pain, Sore Throat, Visual Disturbances and Yellow Eyes. Respiratory Not Present- Bloody sputum, Chronic Cough, Difficulty Breathing, Snoring and Wheezing. Breast Not Present- Breast Mass, Breast Pain, Nipple Discharge and Skin Changes. Cardiovascular Not Present- Chest Pain, Difficulty Breathing Lying Down, Leg Cramps, Palpitations, Rapid Heart Rate, Shortness of Breath and Swelling of Extremities. Gastrointestinal Present- Abdominal Pain, Bloating and Excessive gas. Not Present- Bloody Stool, Change in Bowel Habits, Chronic diarrhea, Constipation, Difficulty Swallowing, Gets full quickly at meals, Hemorrhoids, Indigestion, Nausea, Rectal Pain and Vomiting. Male Genitourinary Present- Frequency. Not Present- Blood in Urine, Change in Urinary Stream, Impotence, Nocturia, Painful Urination, Urgency and Urine Leakage. Musculoskeletal Present- Back Pain. Not Present- Joint Pain, Joint Stiffness, Muscle Pain, Muscle Weakness and Swelling of Extremities. Neurological Not Present- Decreased Memory, Fainting, Headaches, Numbness, Seizures, Tingling, Tremor, Trouble walking and Weakness. Psychiatric Not Present- Anxiety, Bipolar, Change in Sleep Pattern, Depression, Fearful and Frequent crying. Endocrine Not Present- Cold Intolerance, Excessive Hunger, Hair Changes, Heat Intolerance, Hot flashes and New Diabetes. Hematology Not Present- Easy Bruising, Excessive bleeding, Gland problems, HIV and Persistent  Infections.   Vitals Raymond Shan Moffitt RN; 08/21/2014 2:35 PM) 08/21/2014 2:35 PM Weight: 176 lb Height: 70.5in Body Surface Area: 1.99 m Body Mass Index: 24.9 kg/m Temp.: 97.41F(Oral)  Pulse: 60 (Regular)  Resp.: 14 (Unlabored)  BP: 122/78 (Sitting, Left Arm, Standard)    Physical Exam Raymond Holland  Raymond Derby MD; 08/31/2014 11:54 PM) General Mental Status-Alert. General Appearance-Consistent with stated age. Hydration-Well hydrated. Voice-Normal.  Head and Neck Head-normocephalic, atraumatic with no lesions or palpable masses. Trachea-midline. Thyroid Gland Characteristics - normal size and consistency.  Eye Eyeball - Bilateral-Extraocular movements intact. Sclera/Conjunctiva - Bilateral-No scleral icterus.  Chest and Lung Exam Chest and lung exam reveals -quiet, even and easy respiratory effort with no use of accessory muscles and on auscultation, normal breath sounds, no adventitious sounds and normal vocal resonance. Inspection Chest Wall - Normal. Back - normal.  Breast - Did not examine.  Cardiovascular Cardiovascular examination reveals -normal heart sounds, regular rate and rhythm with no murmurs and normal pedal pulses bilaterally.  Abdomen Inspection Inspection of the abdomen reveals - No Hernias. Skin - Scar - no surgical scars. Palpation/Percussion Palpation and Percussion of the abdomen reveal - Soft, Non Tender, No Rebound tenderness, No Rigidity (guarding) and No hepatosplenomegaly. Auscultation Auscultation of the abdomen reveals - Bowel sounds normal.  Peripheral Vascular Upper Extremity Palpation - Pulses bilaterally normal.  Neurologic Neurologic evaluation reveals -alert and oriented x 3 with no impairment of recent or remote memory. Mental Status-Normal.  Neuropsychiatric The patient's mood and affect are described as -normal. Judgment and Insight-insight is appropriate concerning matters relevant to  self.  Musculoskeletal Normal Exam - Left-Upper Extremity Strength Normal and Lower Extremity Strength Normal. Normal Exam - Right-Upper Extremity Strength Normal and Lower Extremity Strength Normal.  Lymphatic Head & Neck  General Head & Neck Lymphatics: Bilateral - Description - Normal. Axillary - Did not examine. Femoral & Inguinal - Did not examine.    Assessment & Plan Raymond Holland M. Rondall Radigan MD; 09/01/2014 91:50 AM) BILIARY COLIC (569.79  Y80.16) Current Plans  I believe the patient's symptoms are consistent with gallbladder disease.  We discussed gallbladder disease. The patient was given Neurosurgeon. We discussed non-operative and operative management. We discussed the signs & symptoms of acute cholecystitis  I discussed laparoscopic cholecystectomy with IOC in detail. The patient was given educational material as well as diagrams detailing the procedure. We discussed the risks and benefits of a laparoscopic cholecystectomy including, but not limited to bleeding, infection, injury to surrounding structures such as the intestine or liver, bile leak, retained gallstones, need to convert to an open procedure, prolonged diarrhea, blood clots such as DVT, common bile duct injury, anesthesia risks, and possible need for additional procedures. We discussed the typical post-operative recovery course. I explained that the likelihood of improvement of their symptoms is fair to good.  The patient has elected to proceed with surgery in several weeks after his trip to [place] for [place]. Redmond Pulling, MD, FACS General, Bariatric, & Minimally Invasive Surgery [facility], [place]

## 2014-10-14 ENCOUNTER — Encounter: Payer: Self-pay | Admitting: General Surgery

## 2014-10-14 NOTE — Progress Notes (Unsigned)
Patient ID: Raymond Holland, male   DOB: September 11, 1933, 81 y.o.   MRN: [id number] He is s/p lap chole by Dr. Redmond Pulling yesterday.  He has been having progressively increasing problems with voiding and now is only dribbling.  He is getting uncomfortable.  I recommended he got to the ED and have a foley catheter placed for acute urinary retention if the situation does not improve with his next attempt to void.

## 2014-10-15 ENCOUNTER — Encounter (HOSPITAL_COMMUNITY): Payer: Self-pay | Admitting: General Surgery

## 2014-10-27 DIAGNOSIS — K8020 Calculus of gallbladder without cholecystitis without obstruction: Secondary | ICD-10-CM | POA: Diagnosis not present

## 2014-11-03 DIAGNOSIS — R972 Elevated prostate specific antigen [PSA]: Secondary | ICD-10-CM | POA: Diagnosis not present

## 2014-11-03 DIAGNOSIS — N401 Enlarged prostate with lower urinary tract symptoms: Secondary | ICD-10-CM | POA: Diagnosis not present

## 2014-11-06 DIAGNOSIS — R972 Elevated prostate specific antigen [PSA]: Secondary | ICD-10-CM | POA: Diagnosis not present

## 2014-11-06 DIAGNOSIS — N401 Enlarged prostate with lower urinary tract symptoms: Secondary | ICD-10-CM | POA: Diagnosis not present

## 2014-12-05 DIAGNOSIS — H53001 Unspecified amblyopia, right eye: Secondary | ICD-10-CM | POA: Diagnosis not present

## 2014-12-05 DIAGNOSIS — H5203 Hypermetropia, bilateral: Secondary | ICD-10-CM | POA: Diagnosis not present

## 2014-12-05 DIAGNOSIS — D3132 Benign neoplasm of left choroid: Secondary | ICD-10-CM | POA: Diagnosis not present

## 2014-12-05 DIAGNOSIS — H2513 Age-related nuclear cataract, bilateral: Secondary | ICD-10-CM | POA: Diagnosis not present

## 2015-02-11 DIAGNOSIS — B353 Tinea pedis: Secondary | ICD-10-CM | POA: Diagnosis not present

## 2015-02-11 DIAGNOSIS — D1801 Hemangioma of skin and subcutaneous tissue: Secondary | ICD-10-CM | POA: Diagnosis not present

## 2015-02-11 DIAGNOSIS — L814 Other melanin hyperpigmentation: Secondary | ICD-10-CM | POA: Diagnosis not present

## 2015-02-11 DIAGNOSIS — L821 Other seborrheic keratosis: Secondary | ICD-10-CM | POA: Diagnosis not present

## 2015-02-11 DIAGNOSIS — D225 Melanocytic nevi of trunk: Secondary | ICD-10-CM | POA: Diagnosis not present

## 2015-03-11 DIAGNOSIS — M25532 Pain in left wrist: Secondary | ICD-10-CM | POA: Diagnosis not present

## 2015-03-11 DIAGNOSIS — Z23 Encounter for immunization: Secondary | ICD-10-CM | POA: Diagnosis not present

## 2015-03-11 DIAGNOSIS — S6992XA Unspecified injury of left wrist, hand and finger(s), initial encounter: Secondary | ICD-10-CM | POA: Diagnosis not present

## 2015-04-01 DIAGNOSIS — R141 Gas pain: Secondary | ICD-10-CM | POA: Diagnosis not present

## 2015-04-01 DIAGNOSIS — R1011 Right upper quadrant pain: Secondary | ICD-10-CM | POA: Diagnosis not present

## 2015-04-03 DIAGNOSIS — M25532 Pain in left wrist: Secondary | ICD-10-CM | POA: Diagnosis not present

## 2015-05-04 DIAGNOSIS — R141 Gas pain: Secondary | ICD-10-CM | POA: Diagnosis not present

## 2015-05-04 DIAGNOSIS — R1011 Right upper quadrant pain: Secondary | ICD-10-CM | POA: Diagnosis not present

## 2015-08-31 DIAGNOSIS — I10 Essential (primary) hypertension: Secondary | ICD-10-CM | POA: Diagnosis not present

## 2015-08-31 DIAGNOSIS — M545 Low back pain: Secondary | ICD-10-CM | POA: Diagnosis not present

## 2015-08-31 DIAGNOSIS — M150 Primary generalized (osteo)arthritis: Secondary | ICD-10-CM | POA: Diagnosis not present

## 2015-08-31 DIAGNOSIS — E782 Mixed hyperlipidemia: Secondary | ICD-10-CM | POA: Diagnosis not present

## 2015-08-31 DIAGNOSIS — Z0000 Encounter for general adult medical examination without abnormal findings: Secondary | ICD-10-CM | POA: Diagnosis not present

## 2015-09-07 DIAGNOSIS — G43909 Migraine, unspecified, not intractable, without status migrainosus: Secondary | ICD-10-CM | POA: Diagnosis not present

## 2015-09-07 DIAGNOSIS — I10 Essential (primary) hypertension: Secondary | ICD-10-CM | POA: Diagnosis not present

## 2015-09-07 DIAGNOSIS — M150 Primary generalized (osteo)arthritis: Secondary | ICD-10-CM | POA: Diagnosis not present

## 2015-09-07 DIAGNOSIS — E782 Mixed hyperlipidemia: Secondary | ICD-10-CM | POA: Diagnosis not present

## 2015-10-21 DIAGNOSIS — G5601 Carpal tunnel syndrome, right upper limb: Secondary | ICD-10-CM | POA: Diagnosis not present

## 2015-10-21 DIAGNOSIS — M5136 Other intervertebral disc degeneration, lumbar region: Secondary | ICD-10-CM | POA: Diagnosis not present

## 2015-11-03 DIAGNOSIS — R972 Elevated prostate specific antigen [PSA]: Secondary | ICD-10-CM | POA: Diagnosis not present

## 2015-11-10 DIAGNOSIS — Z0000 Encounter for general adult medical examination without abnormal findings: Secondary | ICD-10-CM | POA: Diagnosis not present

## 2015-11-10 DIAGNOSIS — N401 Enlarged prostate with lower urinary tract symptoms: Secondary | ICD-10-CM | POA: Diagnosis not present

## 2015-11-10 DIAGNOSIS — R350 Frequency of micturition: Secondary | ICD-10-CM | POA: Diagnosis not present

## 2015-11-10 DIAGNOSIS — R972 Elevated prostate specific antigen [PSA]: Secondary | ICD-10-CM | POA: Diagnosis not present

## 2015-12-16 DIAGNOSIS — H5203 Hypermetropia, bilateral: Secondary | ICD-10-CM | POA: Diagnosis not present

## 2015-12-16 DIAGNOSIS — D3132 Benign neoplasm of left choroid: Secondary | ICD-10-CM | POA: Diagnosis not present

## 2015-12-16 DIAGNOSIS — H53001 Unspecified amblyopia, right eye: Secondary | ICD-10-CM | POA: Diagnosis not present

## 2016-01-26 DIAGNOSIS — M9903 Segmental and somatic dysfunction of lumbar region: Secondary | ICD-10-CM | POA: Diagnosis not present

## 2016-01-26 DIAGNOSIS — M545 Low back pain: Secondary | ICD-10-CM | POA: Diagnosis not present

## 2016-01-26 DIAGNOSIS — M5136 Other intervertebral disc degeneration, lumbar region: Secondary | ICD-10-CM | POA: Diagnosis not present

## 2016-01-26 DIAGNOSIS — M62830 Muscle spasm of back: Secondary | ICD-10-CM | POA: Diagnosis not present

## 2016-01-27 DIAGNOSIS — M62830 Muscle spasm of back: Secondary | ICD-10-CM | POA: Diagnosis not present

## 2016-01-27 DIAGNOSIS — M545 Low back pain: Secondary | ICD-10-CM | POA: Diagnosis not present

## 2016-01-27 DIAGNOSIS — H5340 Unspecified visual field defects: Secondary | ICD-10-CM | POA: Diagnosis not present

## 2016-01-27 DIAGNOSIS — M5136 Other intervertebral disc degeneration, lumbar region: Secondary | ICD-10-CM | POA: Diagnosis not present

## 2016-01-27 DIAGNOSIS — M9903 Segmental and somatic dysfunction of lumbar region: Secondary | ICD-10-CM | POA: Diagnosis not present

## 2016-01-28 DIAGNOSIS — M9903 Segmental and somatic dysfunction of lumbar region: Secondary | ICD-10-CM | POA: Diagnosis not present

## 2016-01-28 DIAGNOSIS — M545 Low back pain: Secondary | ICD-10-CM | POA: Diagnosis not present

## 2016-01-28 DIAGNOSIS — M5136 Other intervertebral disc degeneration, lumbar region: Secondary | ICD-10-CM | POA: Diagnosis not present

## 2016-01-28 DIAGNOSIS — M62830 Muscle spasm of back: Secondary | ICD-10-CM | POA: Diagnosis not present

## 2016-02-02 DIAGNOSIS — M9903 Segmental and somatic dysfunction of lumbar region: Secondary | ICD-10-CM | POA: Diagnosis not present

## 2016-02-02 DIAGNOSIS — M62830 Muscle spasm of back: Secondary | ICD-10-CM | POA: Diagnosis not present

## 2016-02-02 DIAGNOSIS — M545 Low back pain: Secondary | ICD-10-CM | POA: Diagnosis not present

## 2016-02-02 DIAGNOSIS — M5136 Other intervertebral disc degeneration, lumbar region: Secondary | ICD-10-CM | POA: Diagnosis not present

## 2016-02-03 DIAGNOSIS — M5136 Other intervertebral disc degeneration, lumbar region: Secondary | ICD-10-CM | POA: Diagnosis not present

## 2016-02-03 DIAGNOSIS — M545 Low back pain: Secondary | ICD-10-CM | POA: Diagnosis not present

## 2016-02-03 DIAGNOSIS — M9903 Segmental and somatic dysfunction of lumbar region: Secondary | ICD-10-CM | POA: Diagnosis not present

## 2016-02-03 DIAGNOSIS — M62830 Muscle spasm of back: Secondary | ICD-10-CM | POA: Diagnosis not present

## 2016-02-04 DIAGNOSIS — M62830 Muscle spasm of back: Secondary | ICD-10-CM | POA: Diagnosis not present

## 2016-02-04 DIAGNOSIS — M5136 Other intervertebral disc degeneration, lumbar region: Secondary | ICD-10-CM | POA: Diagnosis not present

## 2016-02-04 DIAGNOSIS — M545 Low back pain: Secondary | ICD-10-CM | POA: Diagnosis not present

## 2016-02-04 DIAGNOSIS — M9903 Segmental and somatic dysfunction of lumbar region: Secondary | ICD-10-CM | POA: Diagnosis not present

## 2016-02-09 DIAGNOSIS — M62830 Muscle spasm of back: Secondary | ICD-10-CM | POA: Diagnosis not present

## 2016-02-09 DIAGNOSIS — M5136 Other intervertebral disc degeneration, lumbar region: Secondary | ICD-10-CM | POA: Diagnosis not present

## 2016-02-09 DIAGNOSIS — M545 Low back pain: Secondary | ICD-10-CM | POA: Diagnosis not present

## 2016-02-09 DIAGNOSIS — M9903 Segmental and somatic dysfunction of lumbar region: Secondary | ICD-10-CM | POA: Diagnosis not present

## 2016-02-10 DIAGNOSIS — M9903 Segmental and somatic dysfunction of lumbar region: Secondary | ICD-10-CM | POA: Diagnosis not present

## 2016-02-10 DIAGNOSIS — M5136 Other intervertebral disc degeneration, lumbar region: Secondary | ICD-10-CM | POA: Diagnosis not present

## 2016-02-10 DIAGNOSIS — M62830 Muscle spasm of back: Secondary | ICD-10-CM | POA: Diagnosis not present

## 2016-02-10 DIAGNOSIS — M545 Low back pain: Secondary | ICD-10-CM | POA: Diagnosis not present

## 2016-02-11 DIAGNOSIS — M62830 Muscle spasm of back: Secondary | ICD-10-CM | POA: Diagnosis not present

## 2016-02-11 DIAGNOSIS — M545 Low back pain: Secondary | ICD-10-CM | POA: Diagnosis not present

## 2016-02-11 DIAGNOSIS — M9903 Segmental and somatic dysfunction of lumbar region: Secondary | ICD-10-CM | POA: Diagnosis not present

## 2016-02-11 DIAGNOSIS — M5136 Other intervertebral disc degeneration, lumbar region: Secondary | ICD-10-CM | POA: Diagnosis not present

## 2016-02-23 DIAGNOSIS — M5136 Other intervertebral disc degeneration, lumbar region: Secondary | ICD-10-CM | POA: Diagnosis not present

## 2016-02-23 DIAGNOSIS — M9903 Segmental and somatic dysfunction of lumbar region: Secondary | ICD-10-CM | POA: Diagnosis not present

## 2016-02-23 DIAGNOSIS — M545 Low back pain: Secondary | ICD-10-CM | POA: Diagnosis not present

## 2016-02-23 DIAGNOSIS — M62830 Muscle spasm of back: Secondary | ICD-10-CM | POA: Diagnosis not present

## 2016-02-25 DIAGNOSIS — M545 Low back pain: Secondary | ICD-10-CM | POA: Diagnosis not present

## 2016-02-25 DIAGNOSIS — M5136 Other intervertebral disc degeneration, lumbar region: Secondary | ICD-10-CM | POA: Diagnosis not present

## 2016-02-25 DIAGNOSIS — M62830 Muscle spasm of back: Secondary | ICD-10-CM | POA: Diagnosis not present

## 2016-02-25 DIAGNOSIS — M9903 Segmental and somatic dysfunction of lumbar region: Secondary | ICD-10-CM | POA: Diagnosis not present

## 2016-03-01 DIAGNOSIS — M5136 Other intervertebral disc degeneration, lumbar region: Secondary | ICD-10-CM | POA: Diagnosis not present

## 2016-03-01 DIAGNOSIS — M9903 Segmental and somatic dysfunction of lumbar region: Secondary | ICD-10-CM | POA: Diagnosis not present

## 2016-03-01 DIAGNOSIS — M62830 Muscle spasm of back: Secondary | ICD-10-CM | POA: Diagnosis not present

## 2016-03-01 DIAGNOSIS — M545 Low back pain: Secondary | ICD-10-CM | POA: Diagnosis not present

## 2016-03-03 DIAGNOSIS — M9903 Segmental and somatic dysfunction of lumbar region: Secondary | ICD-10-CM | POA: Diagnosis not present

## 2016-03-03 DIAGNOSIS — M545 Low back pain: Secondary | ICD-10-CM | POA: Diagnosis not present

## 2016-03-03 DIAGNOSIS — M5136 Other intervertebral disc degeneration, lumbar region: Secondary | ICD-10-CM | POA: Diagnosis not present

## 2016-03-03 DIAGNOSIS — M62830 Muscle spasm of back: Secondary | ICD-10-CM | POA: Diagnosis not present

## 2016-03-08 DIAGNOSIS — M62830 Muscle spasm of back: Secondary | ICD-10-CM | POA: Diagnosis not present

## 2016-03-08 DIAGNOSIS — M5136 Other intervertebral disc degeneration, lumbar region: Secondary | ICD-10-CM | POA: Diagnosis not present

## 2016-03-08 DIAGNOSIS — M545 Low back pain: Secondary | ICD-10-CM | POA: Diagnosis not present

## 2016-03-08 DIAGNOSIS — M9903 Segmental and somatic dysfunction of lumbar region: Secondary | ICD-10-CM | POA: Diagnosis not present

## 2016-03-09 DIAGNOSIS — R141 Gas pain: Secondary | ICD-10-CM | POA: Diagnosis not present

## 2016-03-09 DIAGNOSIS — R1031 Right lower quadrant pain: Secondary | ICD-10-CM | POA: Diagnosis not present

## 2016-03-09 DIAGNOSIS — R1011 Right upper quadrant pain: Secondary | ICD-10-CM | POA: Diagnosis not present

## 2016-03-10 DIAGNOSIS — M5136 Other intervertebral disc degeneration, lumbar region: Secondary | ICD-10-CM | POA: Diagnosis not present

## 2016-03-10 DIAGNOSIS — M62830 Muscle spasm of back: Secondary | ICD-10-CM | POA: Diagnosis not present

## 2016-03-10 DIAGNOSIS — M9903 Segmental and somatic dysfunction of lumbar region: Secondary | ICD-10-CM | POA: Diagnosis not present

## 2016-03-10 DIAGNOSIS — M545 Low back pain: Secondary | ICD-10-CM | POA: Diagnosis not present

## 2016-03-11 ENCOUNTER — Other Ambulatory Visit: Payer: Self-pay | Admitting: Gastroenterology

## 2016-03-15 ENCOUNTER — Other Ambulatory Visit: Payer: Self-pay

## 2016-03-15 DIAGNOSIS — M9903 Segmental and somatic dysfunction of lumbar region: Secondary | ICD-10-CM | POA: Diagnosis not present

## 2016-03-15 DIAGNOSIS — M62830 Muscle spasm of back: Secondary | ICD-10-CM | POA: Diagnosis not present

## 2016-03-15 DIAGNOSIS — M545 Low back pain: Secondary | ICD-10-CM | POA: Diagnosis not present

## 2016-03-15 DIAGNOSIS — M5136 Other intervertebral disc degeneration, lumbar region: Secondary | ICD-10-CM | POA: Diagnosis not present

## 2016-03-17 DIAGNOSIS — M62830 Muscle spasm of back: Secondary | ICD-10-CM | POA: Diagnosis not present

## 2016-03-17 DIAGNOSIS — M545 Low back pain: Secondary | ICD-10-CM | POA: Diagnosis not present

## 2016-03-17 DIAGNOSIS — M5136 Other intervertebral disc degeneration, lumbar region: Secondary | ICD-10-CM | POA: Diagnosis not present

## 2016-03-17 DIAGNOSIS — M9903 Segmental and somatic dysfunction of lumbar region: Secondary | ICD-10-CM | POA: Diagnosis not present

## 2016-03-18 ENCOUNTER — Encounter (HOSPITAL_COMMUNITY): Payer: Self-pay | Admitting: *Deleted

## 2016-03-24 NOTE — H&P (Signed)
  Raymond Holland HPI: The patient continues to complain about the right sided pain. He is s/p cholecystectomy 10/2014 for presumed symptomatic cholelithiasis. Sludge was identified in the gallbladder. For a couple of months he felt better, but then with time he concluded that it was not of a benefit. Peppermint oil was tried and it helped, but was not efficacious on a long term basis. His symptoms are typically exacerbated with PO intake. There is a component of worsening with his pain with movement, but it is not to the same degree as PO intake.  Past Medical History:  Diagnosis Date  . Arthritis   . Diverticulosis   . Headache    migraines- none recent  . History of seasonal allergies    Flonase if needed  . Hyperlipidemia   . Hypertension    well controlled with meds  . LBP (low back pain)   . Liver hemangioma    MRI '12 Epic    Past Surgical History:  Procedure Laterality Date  . CHOLECYSTECTOMY N/A 10/13/2014   Procedure: LAPAROSCOPIC CHOLECYSTECTOMY WITH INTRAOPERATIVE CHOLANGIOGRAM;  Surgeon: Greer Pickerel, MD;  Location: [facility];  Service: General;  Laterality: N/A;  . COLONOSCOPY      History reviewed. No pertinent family history.  Social History:  reports that he has never smoked. He has never used smokeless tobacco. He reports that he drinks alcohol. He reports that he does not use drugs.  Allergies:  Allergies  Allergen Reactions  . 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 Has taken other penicilins w/o problem.    Medications:  Scheduled:  Continuous: . sodium chloride    . lactated ringers 1,000 mL (03/25/16 0720)    No results found for this or any previous visit (from the past 24 hour(s)).   No results found.  ROS:  As stated above in the HPI otherwise negative.  There were no vitals taken for this visit.    PE: Gen: NAD, Alert and Oriented HEENT:  [place]/AT, EOMI Neck: Supple, no LAD Lungs: CTA Bilaterally CV:  RRR without M/G/R ABM: Soft, NTND, +BS Ext: No C/C/E  Assessment/Plan: 1) Persistent RUQ pain - EUS.  Emilija Bohman D 03/24/2016, 8:44 AM

## 2016-03-24 NOTE — Anesthesia Preprocedure Evaluation (Signed)
Anesthesia Evaluation  Patient identified by MRN, date of birth, ID band Patient awake    Reviewed: Allergy & Precautions, NPO status , Patient's Chart, lab work & pertinent test results  History of Anesthesia Complications Negative for: history of anesthetic complications  Airway Mallampati: II  TM Distance: >3 FB Neck ROM: Full    Dental no notable dental hx. (+) Dental Advisory Given   Pulmonary neg pulmonary ROS,    Pulmonary exam normal breath sounds clear to auscultation       Cardiovascular hypertension, Pt. on medications Normal cardiovascular exam Rhythm:Regular Rate:Normal     Neuro/Psych negative neurological ROS  negative psych ROS   GI/Hepatic negative GI ROS, Neg liver ROS,   Endo/Other  obesity  Renal/GU negative Renal ROS  negative genitourinary   Musculoskeletal negative musculoskeletal ROS (+)   Abdominal   Peds negative pediatric ROS (+)  Hematology negative hematology ROS (+)   Anesthesia Other Findings   Reproductive/Obstetrics negative OB ROS                             Anesthesia Physical Anesthesia Plan  ASA: II  Anesthesia Plan: MAC   Post-op Pain Management:    Induction: Intravenous  Airway Management Planned: Nasal Cannula  Additional Equipment:   Intra-op Plan:   Post-operative Plan:   Informed Consent: I have reviewed the patients History and Physical, chart, labs and discussed the procedure including the risks, benefits and alternatives for the proposed anesthesia with the patient or authorized representative who has indicated his/her understanding and acceptance.   Dental advisory given  Plan Discussed with: CRNA  Anesthesia Plan Comments:         Anesthesia Quick Evaluation

## 2016-03-25 ENCOUNTER — Ambulatory Visit (HOSPITAL_COMMUNITY)
Admission: RE | Admit: 2016-03-25 | Discharge: 2016-03-25 | Disposition: A | Discharge status: Home / Self Care | Payer: Medicare Other | Source: Ambulatory Visit | Attending: Gastroenterology | Admitting: Gastroenterology

## 2016-03-25 ENCOUNTER — Ambulatory Visit (HOSPITAL_COMMUNITY): Payer: Medicare Other | Admitting: Anesthesiology

## 2016-03-25 ENCOUNTER — Encounter (HOSPITAL_COMMUNITY)
Admission: RE | Disposition: A | Discharge status: Home / Self Care | Payer: Self-pay | Source: Ambulatory Visit | Attending: Gastroenterology

## 2016-03-25 ENCOUNTER — Encounter (HOSPITAL_COMMUNITY): Payer: Self-pay

## 2016-03-25 DIAGNOSIS — Z9049 Acquired absence of other specified parts of digestive tract: Secondary | ICD-10-CM | POA: Insufficient documentation

## 2016-03-25 DIAGNOSIS — Z6824 Body mass index (BMI) 24.0-24.9, adult: Secondary | ICD-10-CM | POA: Diagnosis not present

## 2016-03-25 DIAGNOSIS — I10 Essential (primary) hypertension: Secondary | ICD-10-CM | POA: Insufficient documentation

## 2016-03-25 DIAGNOSIS — E669 Obesity, unspecified: Secondary | ICD-10-CM | POA: Diagnosis not present

## 2016-03-25 DIAGNOSIS — M1990 Unspecified osteoarthritis, unspecified site: Secondary | ICD-10-CM | POA: Insufficient documentation

## 2016-03-25 DIAGNOSIS — R1011 Right upper quadrant pain: Secondary | ICD-10-CM | POA: Insufficient documentation

## 2016-03-25 DIAGNOSIS — K222 Esophageal obstruction: Secondary | ICD-10-CM | POA: Diagnosis not present

## 2016-03-25 DIAGNOSIS — E785 Hyperlipidemia, unspecified: Secondary | ICD-10-CM | POA: Diagnosis not present

## 2016-03-25 HISTORY — PX: EUS: SHX5427

## 2016-03-25 HISTORY — DX: Allergy status to unspecified drugs, medicaments and biological substances: Z88.9

## 2016-03-25 SURGERY — UPPER ENDOSCOPIC ULTRASOUND (EUS) LINEAR
Anesthesia: Monitor Anesthesia Care

## 2016-03-25 MED ORDER — LIDOCAINE 2% (20 MG/ML) 5 ML SYRINGE
INTRAMUSCULAR | Status: AC
  Filled 2016-03-25: qty 5

## 2016-03-25 MED ORDER — PROPOFOL 500 MG/50ML IV EMUL
INTRAVENOUS | Status: DC | PRN
  Administered 2016-03-25: 100 ug/kg/min via INTRAVENOUS

## 2016-03-25 MED ORDER — PROPOFOL 10 MG/ML IV BOLUS
INTRAVENOUS | Status: AC
  Filled 2016-03-25: qty 40

## 2016-03-25 MED ORDER — PROPOFOL 10 MG/ML IV BOLUS
INTRAVENOUS | Status: DC | PRN
  Administered 2016-03-25: 10 mg via INTRAVENOUS
  Administered 2016-03-25 (×2): 20 mg via INTRAVENOUS

## 2016-03-25 MED ORDER — SODIUM CHLORIDE 0.9 % IV SOLN: INTRAVENOUS | Status: DC

## 2016-03-25 MED ORDER — LIDOCAINE 2% (20 MG/ML) 5 ML SYRINGE
INTRAMUSCULAR | Status: DC | PRN
  Administered 2016-03-25: 50 mg via INTRAVENOUS

## 2016-03-25 MED ORDER — LACTATED RINGERS IV SOLN
INTRAVENOUS | Status: DC
  Administered 2016-03-25: 1000 mL via INTRAVENOUS

## 2016-03-25 NOTE — Anesthesia Postprocedure Evaluation (Signed)
Anesthesia Post Note  Patient: Raymond Holland  Procedure(s) Performed: Procedure(s) (LRB): UPPER ENDOSCOPIC ULTRASOUND (EUS) LINEAR (N/A)  Patient location during evaluation: PACU Anesthesia Type: MAC Level of consciousness: awake and alert Pain management: pain level controlled Vital Signs Assessment: post-procedure vital signs reviewed and stable Respiratory status: spontaneous breathing, nonlabored ventilation, respiratory function stable and patient connected to nasal cannula oxygen Cardiovascular status: stable and blood pressure returned to baseline Anesthetic complications: no    Last Vitals:  Vitals:   03/25/16 0816 03/25/16 0840  BP: 126/72 137/68  Pulse: 61 (!) 53  Resp: 13   Temp: 36.4 C     Last Pain:  Vitals:   03/25/16 0816  TempSrc: Oral                 Tremond Shimabukuro JENNETTE

## 2016-03-25 NOTE — Transfer of Care (Signed)
Immediate Anesthesia Transfer of Care Note  Patient: Raymond Holland  Procedure(s) Performed: Procedure(s): UPPER ENDOSCOPIC ULTRASOUND (EUS) LINEAR (N/A)  Patient Location: PACU  Anesthesia Type:MAC  Level of Consciousness: Patient easily awoken, sedated, comfortable, cooperative, following commands, responds to stimulation.   Airway & Oxygen Therapy: Patient spontaneously breathing, ventilating well, oxygen via simple oxygen mask.  Post-op Assessment: Report given to PACU RN, vital signs reviewed and stable, moving all extremities.   Post vital signs: Reviewed and stable.  Complications: No apparent anesthesia complications Last Vitals:  Vitals:   03/25/16 0657 03/25/16 0816  BP: 138/63 126/72  Pulse: (!) 56 61  Resp: 20 13  Temp: 36.4 C 36.4 C    Last Pain:  Vitals:   03/25/16 0816  TempSrc: Oral         Complications: No apparent anesthesia complications

## 2016-03-25 NOTE — Op Note (Signed)
[facility] Patient Name: Raymond Holland Procedure Date: 03/25/2016 MRN: [id number] Attending MD: Carol Ada , MD Date of Birth: [birth year] CSN: [id number] Age: 82 Admit Type: Inpatient Procedure:                Upper EUS Indications:              Abdominal pain in the right upper quadrant Providers:                Carol Ada, MD, Hilma Favors, RN, William Dalton, Technician Referring MD:              Medicines:                Propofol per Anesthesia Complications:            No immediate complications. Estimated Blood Loss:     Estimated blood loss: none. Procedure:                Pre-Anesthesia Assessment:                           - Prior to the procedure, a History and Physical                            was performed, and patient medications and                            allergies were reviewed. The patient's tolerance of                            previous anesthesia was also reviewed. The risks                            and benefits of the procedure and the sedation                            options and risks were discussed with the patient.                            All questions were answered, and informed consent                            was obtained. Prior Anticoagulants: The patient has                            taken no previous anticoagulant or antiplatelet                            agents. ASA Grade Assessment: II - A patient with                            mild systemic disease. After reviewing the risks  and benefits, the patient was deemed in                            satisfactory condition to undergo the procedure.                           After obtaining informed consent, the endoscope was                            passed under direct vision. Throughout the                            procedure, the patient's blood pressure, pulse, and                            oxygen  saturations were monitored continuously. The                            [id number]) scope was introduced through                            the mouth, and advanced to the second part of                            duodenum. The upper EUS was accomplished without                            difficulty. The patient tolerated the procedure                            well. Scope In: Scope Out: Findings:      Endoscopic Finding :      A mild Schatzki ring (acquired) was found in the lower third of the       esophagus.      The entire examined stomach was endoscopically normal.      The examined duodenum was endoscopically normal.      Endosonographic Finding :      Evidence of a previous cholecystectomy was identified       endosonographically.      There was no sign of significant endosonographic abnormality in the       pancreatic head, in the genu of the pancreas, in the pancreatic body, in       the pancreatic tail and in the uncinate process of the pancreas.      No evidence of any sludge or stones in the CBD. The Schazki's ring was       mild and partially disrupted with passage of the echoendoscope. Impression:               - Mild Schatzki ring.                           - Normal stomach.                           - Normal examined duodenum.                           -  Evidence of a cholecystectomy.                           - There was no sign of significant pathology in the                            pancreatic head, in the genu of the pancreas, in                            the pancreatic body, in the pancreatic tail and in                            the uncinate process of the pancreas.                           - No specimens collected. Moderate Sedation:      N/A- Per Anesthesia Care Recommendation:           - Discharge patient to home (ambulatory).                           - Resume regular diet.                           - Use a proton pump inhibitor PO daily.                            - Return to GI clinic in 4 weeks. Procedure Code(s):        --- Professional ---                           [phone], Esophagogastroduodenoscopy, flexible,                            transoral; with endoscopic ultrasound examination                            limited to the esophagus, stomach or duodenum, and                            adjacent structures Diagnosis Code(s):        --- Professional ---                           R10.11, Right upper quadrant pain                           K22.2, Esophageal obstruction                           Z90.49, Acquired absence of other specified parts                            of digestive tract CPT copyright 2016 [employer]. All rights reserved. The codes documented in this report are preliminary and upon coder review may  be revised to  meet current compliance requirements. Carol Ada, MD Carol Ada, MD 03/25/2016 8:25:25 AM This report has been signed electronically. Number of Addenda: 0

## 2016-03-25 NOTE — Discharge Instructions (Signed)
YOU HAD AN ENDOSCOPIC PROCEDURE TODAY: Refer to the procedure report and other information in the discharge instructions given to you for any specific questions about what was found during the examination. If this information does not answer your questions, please call [facility] at [phone] to clarify.  ° °YOU SHOULD EXPECT: Some feelings of bloating in the abdomen. Passage of more gas than usual. Walking can help get rid of the air that was put into your GI tract during the procedure and reduce the bloating. If you had a lower endoscopy (such as a colonoscopy or flexible sigmoidoscopy) you may notice spotting of blood in your stool or on the toilet paper. Some abdominal soreness may be present for a day or two, also. ° °DIET: Your first meal following the procedure should be a light meal and then it is ok to progress to your normal diet. A half-sandwich or bowl of soup is an example of a good first meal. Heavy or fried foods are harder to digest and may make you feel nauseous or bloated. Drink plenty of fluids but you should avoid alcoholic beverages for 24 hours. If you had an esophageal dilation, please see attached information for diet.  ° °ACTIVITY: Your care partner should take you home directly after the procedure. You should plan to take it easy, moving slowly for the rest of the day. You can resume normal activity the day after the procedure however YOU SHOULD NOT DRIVE, use power tools, machinery or perform tasks that involve climbing or major physical exertion for 24 hours (because of the sedation medicines used during the test).  ° °SYMPTOMS TO REPORT IMMEDIATELY: °A gastroenterologist can be reached at any hour. Please call [phone]  for any of the following symptoms:  °Following lower endoscopy (colonoscopy, flexible sigmoidoscopy) °Excessive amounts of blood in the stool  °Significant tenderness, worsening of abdominal pains  °Swelling of the abdomen that is new, acute  °Fever of  100° or higher  °Following upper endoscopy (EGD, EUS, ERCP, esophageal dilation) °Vomiting of blood or coffee ground material  °New, significant abdominal pain  °New, significant chest pain or pain under the shoulder blades  °Painful or persistently difficult swallowing  °New shortness of breath  °Black, tarry-looking or red, bloody stools ° °FOLLOW UP:  °If any biopsies were taken you will be contacted by phone or by letter within the next 1-3 weeks. Call [phone]  if you have not heard about the biopsies in 3 weeks.  °Please also call with any specific questions about appointments or follow up tests. ° °

## 2016-03-28 ENCOUNTER — Encounter (HOSPITAL_COMMUNITY): Payer: Self-pay | Admitting: Gastroenterology

## 2016-03-28 DIAGNOSIS — M150 Primary generalized (osteo)arthritis: Secondary | ICD-10-CM | POA: Diagnosis not present

## 2016-03-28 DIAGNOSIS — E782 Mixed hyperlipidemia: Secondary | ICD-10-CM | POA: Diagnosis not present

## 2016-03-28 DIAGNOSIS — I10 Essential (primary) hypertension: Secondary | ICD-10-CM | POA: Diagnosis not present

## 2016-04-04 DIAGNOSIS — K5730 Diverticulosis of large intestine without perforation or abscess without bleeding: Secondary | ICD-10-CM | POA: Diagnosis not present

## 2016-04-04 DIAGNOSIS — I10 Essential (primary) hypertension: Secondary | ICD-10-CM | POA: Diagnosis not present

## 2016-04-04 DIAGNOSIS — Z23 Encounter for immunization: Secondary | ICD-10-CM | POA: Diagnosis not present

## 2016-04-04 DIAGNOSIS — E782 Mixed hyperlipidemia: Secondary | ICD-10-CM | POA: Diagnosis not present

## 2016-04-18 DIAGNOSIS — R1011 Right upper quadrant pain: Secondary | ICD-10-CM | POA: Diagnosis not present

## 2016-09-21 DIAGNOSIS — K5730 Diverticulosis of large intestine without perforation or abscess without bleeding: Secondary | ICD-10-CM | POA: Diagnosis not present

## 2016-09-21 DIAGNOSIS — E782 Mixed hyperlipidemia: Secondary | ICD-10-CM | POA: Diagnosis not present

## 2016-09-21 DIAGNOSIS — I10 Essential (primary) hypertension: Secondary | ICD-10-CM | POA: Diagnosis not present

## 2016-09-21 DIAGNOSIS — Z0000 Encounter for general adult medical examination without abnormal findings: Secondary | ICD-10-CM | POA: Diagnosis not present

## 2016-09-28 DIAGNOSIS — M150 Primary generalized (osteo)arthritis: Secondary | ICD-10-CM | POA: Diagnosis not present

## 2016-09-28 DIAGNOSIS — I10 Essential (primary) hypertension: Secondary | ICD-10-CM | POA: Diagnosis not present

## 2016-09-28 DIAGNOSIS — E782 Mixed hyperlipidemia: Secondary | ICD-10-CM | POA: Diagnosis not present

## 2016-09-28 DIAGNOSIS — M5136 Other intervertebral disc degeneration, lumbar region: Secondary | ICD-10-CM | POA: Diagnosis not present

## 2016-10-26 DIAGNOSIS — H6981 Other specified disorders of Eustachian tube, right ear: Secondary | ICD-10-CM | POA: Diagnosis not present

## 2016-10-26 DIAGNOSIS — J302 Other seasonal allergic rhinitis: Secondary | ICD-10-CM | POA: Diagnosis not present

## 2016-10-26 DIAGNOSIS — E782 Mixed hyperlipidemia: Secondary | ICD-10-CM | POA: Diagnosis not present

## 2016-10-26 DIAGNOSIS — R05 Cough: Secondary | ICD-10-CM | POA: Diagnosis not present

## 2016-11-13 IMAGING — NM NM HEPATO W/GB/PHARM/[PERSON_NAME]
2 series · 12 of 12 positions shown · non-contrast
Comparison: None.

CLINICAL DATA: Right upper quadrant pain.

EXAM:
NUCLEAR MEDICINE HEPATOBILIARY IMAGING WITH GALLBLADDER EF
TECHNIQUE: Sequential images of the abdomen were obtained [DATE] minutes
following intravenous administration of radiopharmaceutical. After
slow intravenous infusion of 1.6 micrograms Cholecystokinin,
gallbladder ejection fraction was determined.
RADIOPHARMACEUTICALS:  5.0 Millicurie 7c-XXm Choletec

[Series 1: biliary · 4.14mm/px · 6 of 60 frames shown]
[frame 6/60]
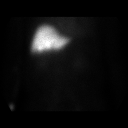
[frame 16/60]
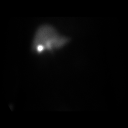
[frame 26/60]
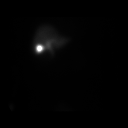
[frame 36/60]
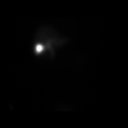
[frame 46/60]
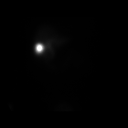
[frame 56/60]
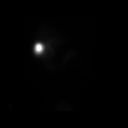

[Series 3: gbef · 4.14mm/px · 6 of 30 frames shown]
[frame 3/30]
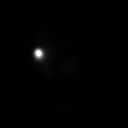
[frame 8/30]
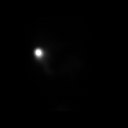
[frame 13/30]
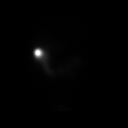
[frame 18/30]
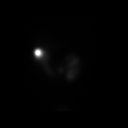
[frame 23/30]
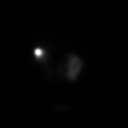
[frame 28/30]
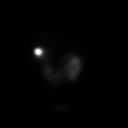

[12 of 12 positions shown; findings below may reference images not displayed]

FINDINGS: Liver biliary system gallbladder and bowel appear normal.
Gallbladder ejection fraction at 30 min is 54%.. At 30 min, normal
ejection fraction is greater than 30%.

The patient did experience mild cramping symptoms during CCK
infusion.
IMPRESSION: Normal exam.

## 2016-11-21 DIAGNOSIS — J309 Allergic rhinitis, unspecified: Secondary | ICD-10-CM | POA: Diagnosis not present

## 2016-11-21 DIAGNOSIS — H90A21 Sensorineural hearing loss, unilateral, right ear, with restricted hearing on the contralateral side: Secondary | ICD-10-CM | POA: Diagnosis not present

## 2016-11-21 DIAGNOSIS — H1045 Other chronic allergic conjunctivitis: Secondary | ICD-10-CM | POA: Diagnosis not present

## 2016-11-21 DIAGNOSIS — R05 Cough: Secondary | ICD-10-CM | POA: Diagnosis not present

## 2016-11-21 DIAGNOSIS — H90A32 Mixed conductive and sensorineural hearing loss, unilateral, left ear with restricted hearing on the contralateral side: Secondary | ICD-10-CM | POA: Diagnosis not present

## 2016-11-21 DIAGNOSIS — H9012 Conductive hearing loss, unilateral, left ear, with unrestricted hearing on the contralateral side: Secondary | ICD-10-CM | POA: Diagnosis not present

## 2016-11-21 DIAGNOSIS — K219 Gastro-esophageal reflux disease without esophagitis: Secondary | ICD-10-CM | POA: Diagnosis not present

## 2016-11-21 DIAGNOSIS — H6983 Other specified disorders of Eustachian tube, bilateral: Secondary | ICD-10-CM | POA: Diagnosis not present

## 2016-11-24 ENCOUNTER — Other Ambulatory Visit: Payer: Self-pay | Admitting: Allergy

## 2016-11-24 ENCOUNTER — Ambulatory Visit
Admission: RE | Admit: 2016-11-24 | Discharge: 2016-11-24 | Disposition: A | Discharge status: Home / Self Care | Payer: Medicare Other | Source: Ambulatory Visit | Attending: Allergy | Admitting: Allergy

## 2016-11-24 DIAGNOSIS — R059 Cough, unspecified: Secondary | ICD-10-CM

## 2016-11-24 DIAGNOSIS — R05 Cough: Secondary | ICD-10-CM | POA: Diagnosis not present

## 2016-11-25 ENCOUNTER — Other Ambulatory Visit: Payer: Self-pay | Admitting: Allergy

## 2016-11-25 ENCOUNTER — Ambulatory Visit
Admission: RE | Admit: 2016-11-25 | Discharge: 2016-11-25 | Disposition: A | Discharge status: Home / Self Care | Payer: Medicare Other | Source: Ambulatory Visit | Attending: Allergy | Admitting: Allergy

## 2016-11-25 DIAGNOSIS — R05 Cough: Secondary | ICD-10-CM

## 2016-11-25 DIAGNOSIS — R059 Cough, unspecified: Secondary | ICD-10-CM

## 2016-11-25 DIAGNOSIS — R918 Other nonspecific abnormal finding of lung field: Secondary | ICD-10-CM | POA: Diagnosis not present

## 2017-01-31 DIAGNOSIS — R972 Elevated prostate specific antigen [PSA]: Secondary | ICD-10-CM | POA: Diagnosis not present

## 2017-01-31 DIAGNOSIS — D3132 Benign neoplasm of left choroid: Secondary | ICD-10-CM | POA: Diagnosis not present

## 2017-01-31 DIAGNOSIS — H5203 Hypermetropia, bilateral: Secondary | ICD-10-CM | POA: Diagnosis not present

## 2017-01-31 DIAGNOSIS — H2513 Age-related nuclear cataract, bilateral: Secondary | ICD-10-CM | POA: Diagnosis not present

## 2017-02-07 DIAGNOSIS — R972 Elevated prostate specific antigen [PSA]: Secondary | ICD-10-CM | POA: Diagnosis not present

## 2017-02-08 DIAGNOSIS — L821 Other seborrheic keratosis: Secondary | ICD-10-CM | POA: Diagnosis not present

## 2017-02-08 DIAGNOSIS — L814 Other melanin hyperpigmentation: Secondary | ICD-10-CM | POA: Diagnosis not present

## 2017-02-08 DIAGNOSIS — L570 Actinic keratosis: Secondary | ICD-10-CM | POA: Diagnosis not present

## 2017-02-08 DIAGNOSIS — D1801 Hemangioma of skin and subcutaneous tissue: Secondary | ICD-10-CM | POA: Diagnosis not present

## 2017-02-08 DIAGNOSIS — D225 Melanocytic nevi of trunk: Secondary | ICD-10-CM | POA: Diagnosis not present

## 2017-03-29 DIAGNOSIS — R7303 Prediabetes: Secondary | ICD-10-CM | POA: Diagnosis not present

## 2017-03-29 DIAGNOSIS — E782 Mixed hyperlipidemia: Secondary | ICD-10-CM | POA: Diagnosis not present

## 2017-04-05 DIAGNOSIS — E782 Mixed hyperlipidemia: Secondary | ICD-10-CM | POA: Diagnosis not present

## 2017-04-05 DIAGNOSIS — R7303 Prediabetes: Secondary | ICD-10-CM | POA: Diagnosis not present

## 2017-04-05 DIAGNOSIS — Z23 Encounter for immunization: Secondary | ICD-10-CM | POA: Diagnosis not present

## 2017-04-05 DIAGNOSIS — I10 Essential (primary) hypertension: Secondary | ICD-10-CM | POA: Diagnosis not present

## 2017-10-05 DIAGNOSIS — R7303 Prediabetes: Secondary | ICD-10-CM | POA: Diagnosis not present

## 2017-10-05 DIAGNOSIS — Z0000 Encounter for general adult medical examination without abnormal findings: Secondary | ICD-10-CM | POA: Diagnosis not present

## 2017-10-05 DIAGNOSIS — E782 Mixed hyperlipidemia: Secondary | ICD-10-CM | POA: Diagnosis not present

## 2017-10-05 DIAGNOSIS — I10 Essential (primary) hypertension: Secondary | ICD-10-CM | POA: Diagnosis not present

## 2017-10-09 DIAGNOSIS — R69 Illness, unspecified: Secondary | ICD-10-CM | POA: Diagnosis not present

## 2017-10-12 DIAGNOSIS — M545 Low back pain: Secondary | ICD-10-CM | POA: Diagnosis not present

## 2017-10-12 DIAGNOSIS — I10 Essential (primary) hypertension: Secondary | ICD-10-CM | POA: Diagnosis not present

## 2017-10-12 DIAGNOSIS — R7303 Prediabetes: Secondary | ICD-10-CM | POA: Diagnosis not present

## 2017-10-12 DIAGNOSIS — Z23 Encounter for immunization: Secondary | ICD-10-CM | POA: Diagnosis not present

## 2017-10-12 DIAGNOSIS — M150 Primary generalized (osteo)arthritis: Secondary | ICD-10-CM | POA: Diagnosis not present

## 2017-10-12 DIAGNOSIS — E782 Mixed hyperlipidemia: Secondary | ICD-10-CM | POA: Diagnosis not present

## 2017-12-29 DIAGNOSIS — R0981 Nasal congestion: Secondary | ICD-10-CM | POA: Diagnosis not present

## 2018-02-05 DIAGNOSIS — D3132 Benign neoplasm of left choroid: Secondary | ICD-10-CM | POA: Diagnosis not present

## 2018-02-05 DIAGNOSIS — H5340 Unspecified visual field defects: Secondary | ICD-10-CM | POA: Diagnosis not present

## 2018-02-05 DIAGNOSIS — H2513 Age-related nuclear cataract, bilateral: Secondary | ICD-10-CM | POA: Diagnosis not present

## 2018-02-05 DIAGNOSIS — H5203 Hypermetropia, bilateral: Secondary | ICD-10-CM | POA: Diagnosis not present

## 2018-02-19 DIAGNOSIS — L814 Other melanin hyperpigmentation: Secondary | ICD-10-CM | POA: Diagnosis not present

## 2018-02-19 DIAGNOSIS — D1801 Hemangioma of skin and subcutaneous tissue: Secondary | ICD-10-CM | POA: Diagnosis not present

## 2018-02-19 DIAGNOSIS — D225 Melanocytic nevi of trunk: Secondary | ICD-10-CM | POA: Diagnosis not present

## 2018-02-19 DIAGNOSIS — L821 Other seborrheic keratosis: Secondary | ICD-10-CM | POA: Diagnosis not present

## 2018-02-20 DIAGNOSIS — R351 Nocturia: Secondary | ICD-10-CM | POA: Diagnosis not present

## 2018-02-20 DIAGNOSIS — N401 Enlarged prostate with lower urinary tract symptoms: Secondary | ICD-10-CM | POA: Diagnosis not present

## 2018-02-20 DIAGNOSIS — R972 Elevated prostate specific antigen [PSA]: Secondary | ICD-10-CM | POA: Diagnosis not present

## 2018-02-20 DIAGNOSIS — R69 Illness, unspecified: Secondary | ICD-10-CM | POA: Diagnosis not present

## 2018-03-13 DIAGNOSIS — R69 Illness, unspecified: Secondary | ICD-10-CM | POA: Diagnosis not present

## 2018-03-23 DIAGNOSIS — Z23 Encounter for immunization: Secondary | ICD-10-CM | POA: Diagnosis not present

## 2018-03-29 DIAGNOSIS — R7303 Prediabetes: Secondary | ICD-10-CM | POA: Diagnosis not present

## 2018-03-29 DIAGNOSIS — E782 Mixed hyperlipidemia: Secondary | ICD-10-CM | POA: Diagnosis not present

## 2018-03-29 DIAGNOSIS — I10 Essential (primary) hypertension: Secondary | ICD-10-CM | POA: Diagnosis not present

## 2018-04-05 DIAGNOSIS — E782 Mixed hyperlipidemia: Secondary | ICD-10-CM | POA: Diagnosis not present

## 2018-04-05 DIAGNOSIS — I10 Essential (primary) hypertension: Secondary | ICD-10-CM | POA: Diagnosis not present

## 2018-04-05 DIAGNOSIS — R7303 Prediabetes: Secondary | ICD-10-CM | POA: Diagnosis not present

## 2018-09-19 DIAGNOSIS — H6120 Impacted cerumen, unspecified ear: Secondary | ICD-10-CM | POA: Diagnosis not present

## 2018-09-19 DIAGNOSIS — I10 Essential (primary) hypertension: Secondary | ICD-10-CM | POA: Diagnosis not present

## 2018-09-19 DIAGNOSIS — J0100 Acute maxillary sinusitis, unspecified: Secondary | ICD-10-CM | POA: Diagnosis not present

## 2019-03-07 IMAGING — CT CT CHEST W/O CM
1 series · 15 of 33 positions shown, 19 images · non-contrast
Comparison: Chest radiograph, 11/24/2016.

CLINICAL DATA: Cough. Small questionable nodules in the lower lungs
on the recent chest radiograph.

EXAM:
CT CHEST WITHOUT CONTRAST
TECHNIQUE: Multidetector CT imaging of the chest was performed following the
standard protocol without IV contrast.

[Series 2: chest w/(date) · axial · 0.69mm/px · z∈[-379,-81]mm · 15 of 175 slices shown, 19 images]
[im 13/175  mediastinal]
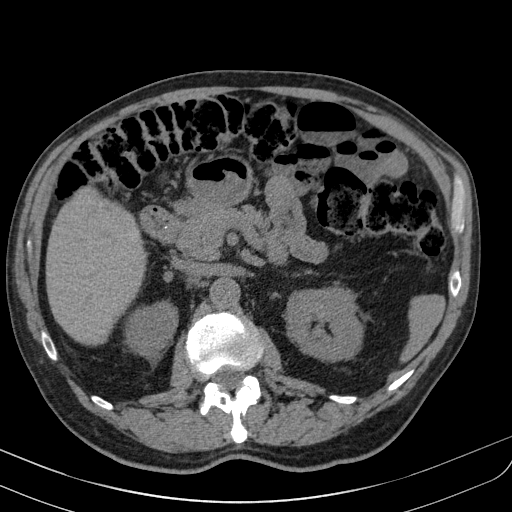
[im 13/175  lung]
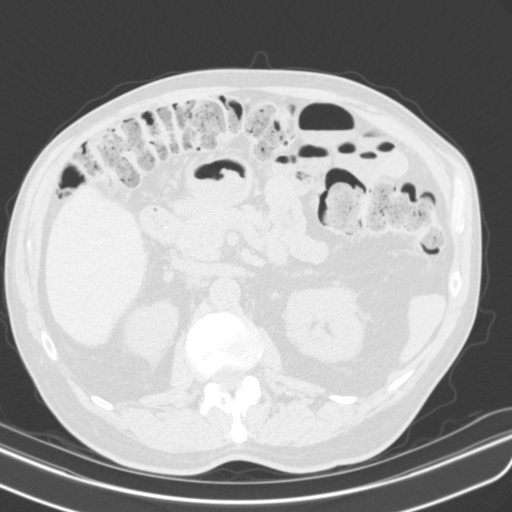
[im 26/175  lung]
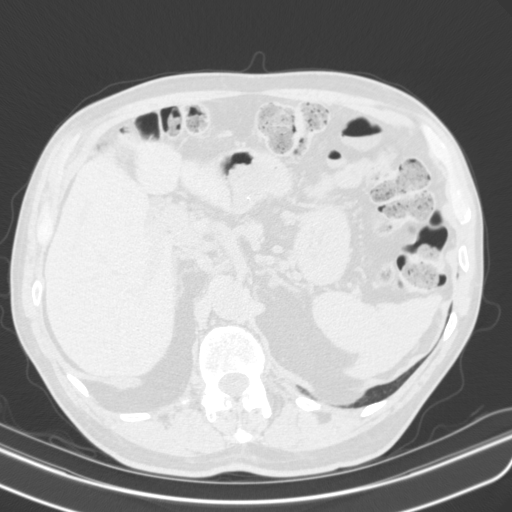
[im 35/175  lung]
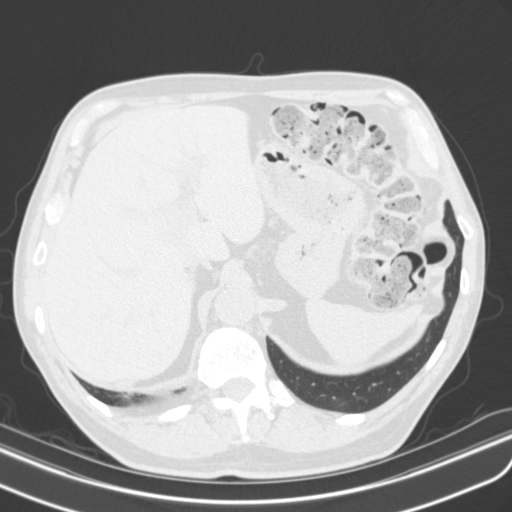
[im 46/175  lung]
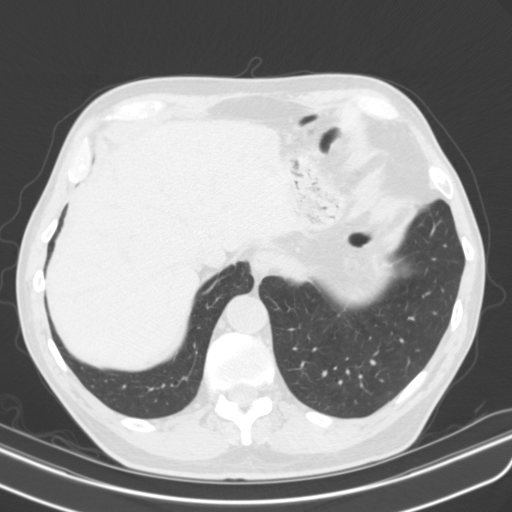
[im 59/175  mediastinal]
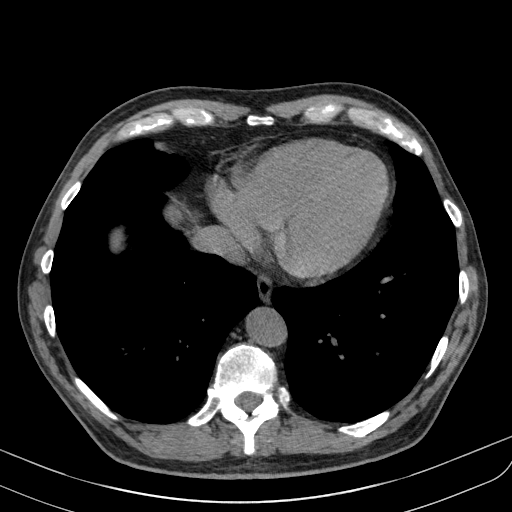
[im 59/175  lung]
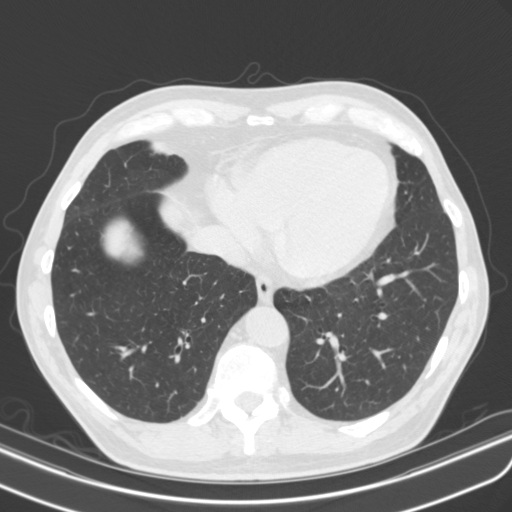
[im 70/175  lung]
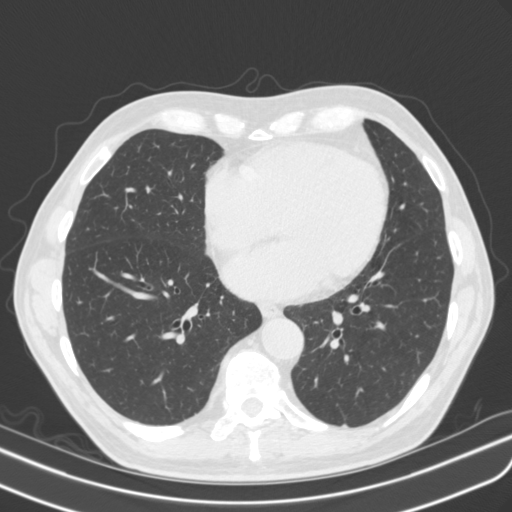
[im 78/175  lung]
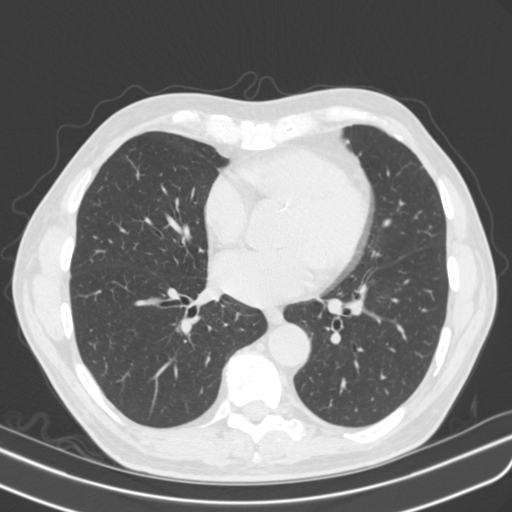
[im 91/175  lung]
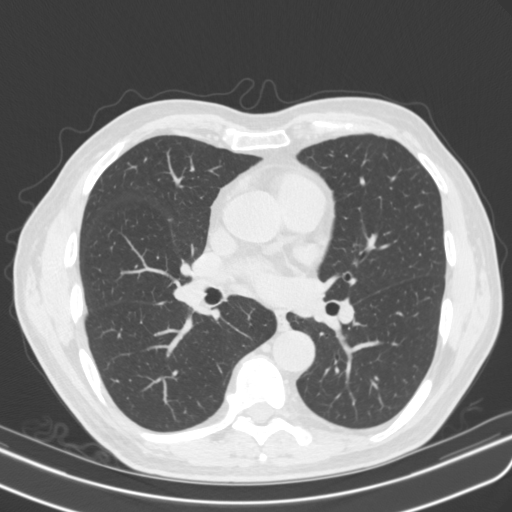
[im 97/175  mediastinal]
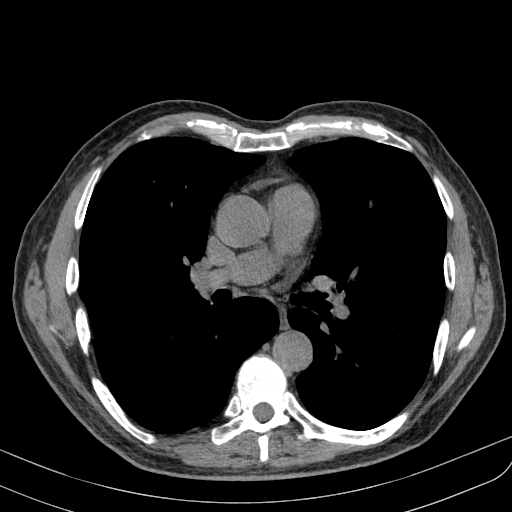
[im 97/175  lung]
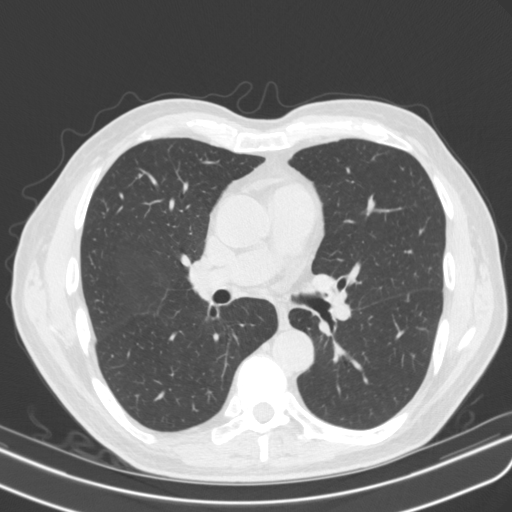
[im 105/175  lung]
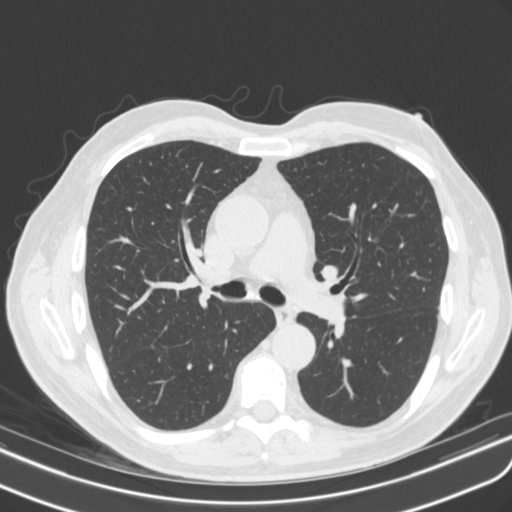
[im 117/175  lung]
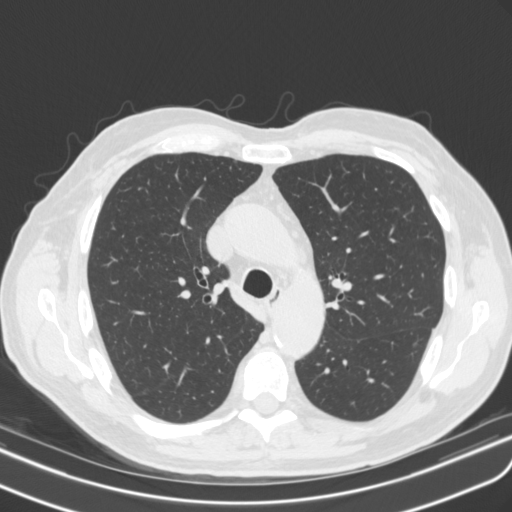
[im 129/175  lung]
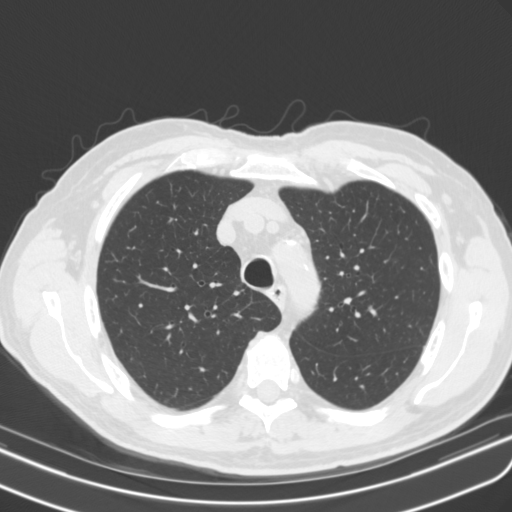
[im 140/175  mediastinal]
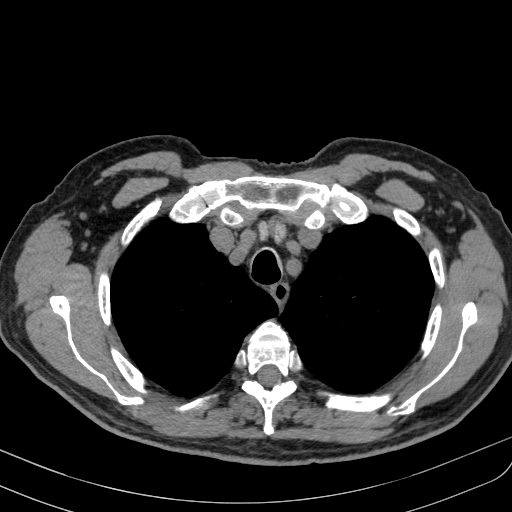
[im 140/175  lung]
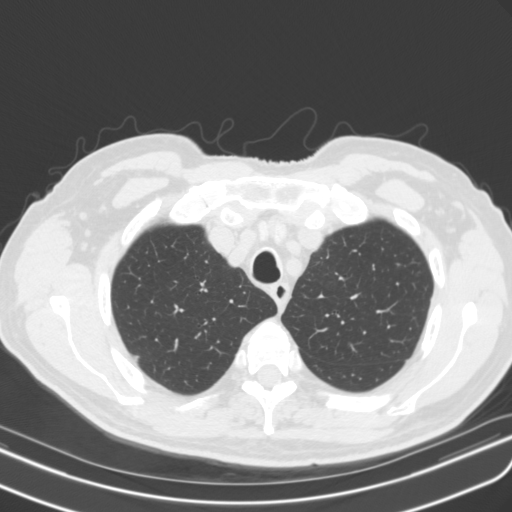
[im 149/175  lung]
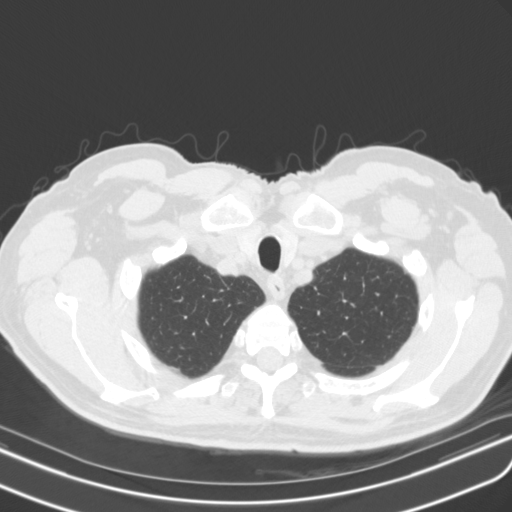
[im 162/175  lung]
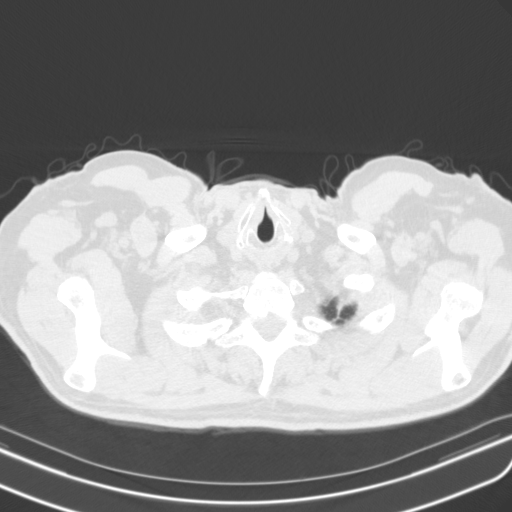

[15 of 33 positions shown; findings below may reference images not displayed]

FINDINGS: Cardiovascular: Heart is normal in size. No pericardial effusion.
There are minor coronary artery calcifications. Mild atherosclerotic
calcifications noted along the aortic arch. Great vessels otherwise
unremarkable.

Mediastinum/Nodes: No enlarged mediastinal or axillary lymph nodes.
Thyroid gland, trachea, and esophagus demonstrate no significant
findings.

Lungs/Pleura: 4 mm pleural-based nodule in the left lower lobe,
image 135, series 5. Smaller triangular-shaped pleural-based nodule
noted in the left lower lobe, image 106. Adjacent 2 mm pleural-based
nodule in the left lower lobe, image 108. Small fairly dense, 3 mm
irregular nodule in the anterior right upper lobe, image 54.

There are no new lung opacities or nodules to correspond to the
small nodular opacities noted on the current chest radiograph. The
chest radiographic findings are due to nipple shadows.

There is no evidence of pneumonia or pulmonary edema. No pleural
effusion or pneumothorax.

Upper Abdomen: No acute abnormality.

Musculoskeletal: No fracture or acute finding. No osteoblastic or
osteolytic lesions.
IMPRESSION: 1. Small nodular opacities noted on the current chest radiograph for
due to nipple shadows.
2. There are small lung nodules as detailed above, largest measuring
4 mm. No follow-up needed if patient is low-risk (and has no known
or suspected primary neoplasm). Non-contrast chest CT can be
considered in 12 months if patient is high-risk. This recommendation
follows the consensus statement: Guidelines for Management of
Incidental Pulmonary Nodules Detected on CT Images: From the
3. No acute findings.  No evidence of pneumonia or pulmonary edema.

## 2019-04-05 DIAGNOSIS — Z23 Encounter for immunization: Secondary | ICD-10-CM | POA: Diagnosis not present

## 2019-04-18 DIAGNOSIS — E782 Mixed hyperlipidemia: Secondary | ICD-10-CM | POA: Diagnosis not present

## 2019-04-18 DIAGNOSIS — I10 Essential (primary) hypertension: Secondary | ICD-10-CM | POA: Diagnosis not present

## 2019-04-18 DIAGNOSIS — R7303 Prediabetes: Secondary | ICD-10-CM | POA: Diagnosis not present

## 2019-04-25 DIAGNOSIS — K5730 Diverticulosis of large intestine without perforation or abscess without bleeding: Secondary | ICD-10-CM | POA: Diagnosis not present

## 2019-04-25 DIAGNOSIS — E782 Mixed hyperlipidemia: Secondary | ICD-10-CM | POA: Diagnosis not present

## 2019-04-25 DIAGNOSIS — B353 Tinea pedis: Secondary | ICD-10-CM | POA: Diagnosis not present

## 2019-04-25 DIAGNOSIS — Z7189 Other specified counseling: Secondary | ICD-10-CM | POA: Diagnosis not present

## 2019-04-25 DIAGNOSIS — Z0000 Encounter for general adult medical examination without abnormal findings: Secondary | ICD-10-CM | POA: Diagnosis not present

## 2019-04-25 DIAGNOSIS — M150 Primary generalized (osteo)arthritis: Secondary | ICD-10-CM | POA: Diagnosis not present

## 2019-04-25 DIAGNOSIS — R002 Palpitations: Secondary | ICD-10-CM | POA: Diagnosis not present

## 2019-04-25 DIAGNOSIS — R7303 Prediabetes: Secondary | ICD-10-CM | POA: Diagnosis not present

## 2019-04-25 DIAGNOSIS — I10 Essential (primary) hypertension: Secondary | ICD-10-CM | POA: Diagnosis not present

## 2019-04-25 DIAGNOSIS — D1803 Hemangioma of intra-abdominal structures: Secondary | ICD-10-CM | POA: Diagnosis not present

## 2019-05-06 DIAGNOSIS — H2513 Age-related nuclear cataract, bilateral: Secondary | ICD-10-CM | POA: Diagnosis not present

## 2019-05-06 DIAGNOSIS — D3131 Benign neoplasm of right choroid: Secondary | ICD-10-CM | POA: Diagnosis not present

## 2019-05-06 DIAGNOSIS — D3132 Benign neoplasm of left choroid: Secondary | ICD-10-CM | POA: Diagnosis not present

## 2019-05-06 DIAGNOSIS — H524 Presbyopia: Secondary | ICD-10-CM | POA: Diagnosis not present

## 2019-05-18 DIAGNOSIS — R002 Palpitations: Secondary | ICD-10-CM | POA: Diagnosis not present

## 2019-05-27 DIAGNOSIS — R002 Palpitations: Secondary | ICD-10-CM | POA: Diagnosis not present

## 2019-07-18 DIAGNOSIS — H2511 Age-related nuclear cataract, right eye: Secondary | ICD-10-CM | POA: Diagnosis not present

## 2019-07-25 DIAGNOSIS — H25812 Combined forms of age-related cataract, left eye: Secondary | ICD-10-CM | POA: Diagnosis not present

## 2019-07-25 DIAGNOSIS — H2512 Age-related nuclear cataract, left eye: Secondary | ICD-10-CM | POA: Diagnosis not present

## 2019-07-31 ENCOUNTER — Ambulatory Visit: Payer: Medicare Other | Attending: Internal Medicine

## 2019-07-31 DIAGNOSIS — Z23 Encounter for immunization: Secondary | ICD-10-CM | POA: Insufficient documentation

## 2019-07-31 NOTE — Progress Notes (Signed)
   Covid-19 Vaccination Clinic  Name:  Raymond Holland    MRN: [id number] DOB: Jan 04, 1934  07/31/2019  Raymond Holland was observed post Covid-19 immunization for 15 minutes without incidence. He was provided with Vaccine Information Sheet and instruction to access the V-Safe system.   Raymond Holland was instructed to call 911 with any severe reactions post vaccine: Marland Kitchen Difficulty breathing  . Swelling of your face and throat  . A fast heartbeat  . A bad rash all over your body  . Dizziness and weakness    Immunizations Administered    Name Date Dose VIS Date Route   Pfizer COVID-19 Vaccine 07/31/2019  8:53 AM 0.3 mL 06/21/2019 Intramuscular   Manufacturer: [place]   Lot: [id number]   [place]: [id number]

## 2019-08-12 DIAGNOSIS — N401 Enlarged prostate with lower urinary tract symptoms: Secondary | ICD-10-CM | POA: Diagnosis not present

## 2019-08-12 DIAGNOSIS — R3914 Feeling of incomplete bladder emptying: Secondary | ICD-10-CM | POA: Diagnosis not present

## 2019-08-19 ENCOUNTER — Ambulatory Visit: Payer: Medicare HMO | Attending: Internal Medicine

## 2019-08-19 DIAGNOSIS — Z23 Encounter for immunization: Secondary | ICD-10-CM | POA: Insufficient documentation

## 2019-08-19 NOTE — Progress Notes (Signed)
   Covid-19 Vaccination Clinic  Name:  Raymond Holland    MRN: [id number] DOB: 10-02-1933  08/19/2019  [name] was observed post Covid-19 immunization for 15 minutes without incidence. He was provided with Vaccine Information Sheet and instruction to access the V-Safe system.   [name] was instructed to call 911 with any severe reactions post vaccine: Marland Kitchen Difficulty breathing  . Swelling of your face and throat  . A fast heartbeat  . A bad rash all over your body  . Dizziness and weakness    Immunizations Administered    Name Date Dose VIS Date Route   Pfizer COVID-19 Vaccine 08/19/2019 11:16 AM 0.3 mL 06/21/2019 Intramuscular   Manufacturer: [place]   Lot: [id number]   [place]: [id number]

## 2019-08-23 DIAGNOSIS — H35351 Cystoid macular degeneration, right eye: Secondary | ICD-10-CM | POA: Diagnosis not present

## 2019-09-20 DIAGNOSIS — H35351 Cystoid macular degeneration, right eye: Secondary | ICD-10-CM | POA: Diagnosis not present

## 2019-10-01 DIAGNOSIS — H5051 Esophoria: Secondary | ICD-10-CM | POA: Diagnosis not present

## 2019-10-01 DIAGNOSIS — H35351 Cystoid macular degeneration, right eye: Secondary | ICD-10-CM | POA: Diagnosis not present

## 2019-10-01 DIAGNOSIS — D3131 Benign neoplasm of right choroid: Secondary | ICD-10-CM | POA: Diagnosis not present

## 2019-10-01 DIAGNOSIS — H35361 Drusen (degenerative) of macula, right eye: Secondary | ICD-10-CM | POA: Diagnosis not present

## 2019-10-01 DIAGNOSIS — H47391 Other disorders of optic disc, right eye: Secondary | ICD-10-CM | POA: Diagnosis not present

## 2019-10-01 DIAGNOSIS — H53001 Unspecified amblyopia, right eye: Secondary | ICD-10-CM | POA: Diagnosis not present

## 2019-10-01 DIAGNOSIS — D3132 Benign neoplasm of left choroid: Secondary | ICD-10-CM | POA: Diagnosis not present

## 2019-10-13 DIAGNOSIS — H1031 Unspecified acute conjunctivitis, right eye: Secondary | ICD-10-CM | POA: Diagnosis not present

## 2019-10-15 DIAGNOSIS — H5051 Esophoria: Secondary | ICD-10-CM | POA: Diagnosis not present

## 2019-10-15 DIAGNOSIS — D3132 Benign neoplasm of left choroid: Secondary | ICD-10-CM | POA: Diagnosis not present

## 2019-10-15 DIAGNOSIS — H35363 Drusen (degenerative) of macula, bilateral: Secondary | ICD-10-CM | POA: Diagnosis not present

## 2019-10-15 DIAGNOSIS — Z961 Presence of intraocular lens: Secondary | ICD-10-CM | POA: Diagnosis not present

## 2019-10-15 DIAGNOSIS — H47391 Other disorders of optic disc, right eye: Secondary | ICD-10-CM | POA: Diagnosis not present

## 2019-10-15 DIAGNOSIS — H53001 Unspecified amblyopia, right eye: Secondary | ICD-10-CM | POA: Diagnosis not present

## 2019-10-15 DIAGNOSIS — H3509 Other intraretinal microvascular abnormalities: Secondary | ICD-10-CM | POA: Diagnosis not present

## 2019-10-15 DIAGNOSIS — H35351 Cystoid macular degeneration, right eye: Secondary | ICD-10-CM | POA: Diagnosis not present

## 2019-10-16 DIAGNOSIS — R69 Illness, unspecified: Secondary | ICD-10-CM | POA: Diagnosis not present

## 2019-10-29 DIAGNOSIS — H0012 Chalazion right lower eyelid: Secondary | ICD-10-CM | POA: Diagnosis not present

## 2019-10-29 DIAGNOSIS — H0011 Chalazion right upper eyelid: Secondary | ICD-10-CM | POA: Diagnosis not present

## 2020-02-27 DIAGNOSIS — L814 Other melanin hyperpigmentation: Secondary | ICD-10-CM | POA: Diagnosis not present

## 2020-02-27 DIAGNOSIS — L821 Other seborrheic keratosis: Secondary | ICD-10-CM | POA: Diagnosis not present

## 2020-02-27 DIAGNOSIS — D225 Melanocytic nevi of trunk: Secondary | ICD-10-CM | POA: Diagnosis not present

## 2020-02-27 DIAGNOSIS — B351 Tinea unguium: Secondary | ICD-10-CM | POA: Diagnosis not present

## 2020-02-27 DIAGNOSIS — L578 Other skin changes due to chronic exposure to nonionizing radiation: Secondary | ICD-10-CM | POA: Diagnosis not present

## 2020-03-02 DIAGNOSIS — M9905 Segmental and somatic dysfunction of pelvic region: Secondary | ICD-10-CM | POA: Diagnosis not present

## 2020-03-02 DIAGNOSIS — M9903 Segmental and somatic dysfunction of lumbar region: Secondary | ICD-10-CM | POA: Diagnosis not present

## 2020-03-02 DIAGNOSIS — M21751 Unequal limb length (acquired), right femur: Secondary | ICD-10-CM | POA: Diagnosis not present

## 2020-03-02 DIAGNOSIS — M41126 Adolescent idiopathic scoliosis, lumbar region: Secondary | ICD-10-CM | POA: Diagnosis not present

## 2020-03-04 DIAGNOSIS — M41126 Adolescent idiopathic scoliosis, lumbar region: Secondary | ICD-10-CM | POA: Diagnosis not present

## 2020-03-04 DIAGNOSIS — M9905 Segmental and somatic dysfunction of pelvic region: Secondary | ICD-10-CM | POA: Diagnosis not present

## 2020-03-04 DIAGNOSIS — M21751 Unequal limb length (acquired), right femur: Secondary | ICD-10-CM | POA: Diagnosis not present

## 2020-03-04 DIAGNOSIS — M9903 Segmental and somatic dysfunction of lumbar region: Secondary | ICD-10-CM | POA: Diagnosis not present

## 2020-03-06 DIAGNOSIS — M9903 Segmental and somatic dysfunction of lumbar region: Secondary | ICD-10-CM | POA: Diagnosis not present

## 2020-03-06 DIAGNOSIS — M41126 Adolescent idiopathic scoliosis, lumbar region: Secondary | ICD-10-CM | POA: Diagnosis not present

## 2020-03-06 DIAGNOSIS — M9905 Segmental and somatic dysfunction of pelvic region: Secondary | ICD-10-CM | POA: Diagnosis not present

## 2020-03-06 DIAGNOSIS — M21751 Unequal limb length (acquired), right femur: Secondary | ICD-10-CM | POA: Diagnosis not present

## 2020-03-09 DIAGNOSIS — M41126 Adolescent idiopathic scoliosis, lumbar region: Secondary | ICD-10-CM | POA: Diagnosis not present

## 2020-03-09 DIAGNOSIS — M9903 Segmental and somatic dysfunction of lumbar region: Secondary | ICD-10-CM | POA: Diagnosis not present

## 2020-03-09 DIAGNOSIS — M21751 Unequal limb length (acquired), right femur: Secondary | ICD-10-CM | POA: Diagnosis not present

## 2020-03-09 DIAGNOSIS — M9905 Segmental and somatic dysfunction of pelvic region: Secondary | ICD-10-CM | POA: Diagnosis not present

## 2020-03-11 DIAGNOSIS — M9905 Segmental and somatic dysfunction of pelvic region: Secondary | ICD-10-CM | POA: Diagnosis not present

## 2020-03-11 DIAGNOSIS — M21751 Unequal limb length (acquired), right femur: Secondary | ICD-10-CM | POA: Diagnosis not present

## 2020-03-11 DIAGNOSIS — M9903 Segmental and somatic dysfunction of lumbar region: Secondary | ICD-10-CM | POA: Diagnosis not present

## 2020-03-11 DIAGNOSIS — M41126 Adolescent idiopathic scoliosis, lumbar region: Secondary | ICD-10-CM | POA: Diagnosis not present

## 2020-03-13 DIAGNOSIS — M41126 Adolescent idiopathic scoliosis, lumbar region: Secondary | ICD-10-CM | POA: Diagnosis not present

## 2020-03-13 DIAGNOSIS — M21751 Unequal limb length (acquired), right femur: Secondary | ICD-10-CM | POA: Diagnosis not present

## 2020-03-13 DIAGNOSIS — M9905 Segmental and somatic dysfunction of pelvic region: Secondary | ICD-10-CM | POA: Diagnosis not present

## 2020-03-13 DIAGNOSIS — M9903 Segmental and somatic dysfunction of lumbar region: Secondary | ICD-10-CM | POA: Diagnosis not present

## 2020-03-17 DIAGNOSIS — M21751 Unequal limb length (acquired), right femur: Secondary | ICD-10-CM | POA: Diagnosis not present

## 2020-03-17 DIAGNOSIS — M9903 Segmental and somatic dysfunction of lumbar region: Secondary | ICD-10-CM | POA: Diagnosis not present

## 2020-03-17 DIAGNOSIS — M9905 Segmental and somatic dysfunction of pelvic region: Secondary | ICD-10-CM | POA: Diagnosis not present

## 2020-03-17 DIAGNOSIS — M41126 Adolescent idiopathic scoliosis, lumbar region: Secondary | ICD-10-CM | POA: Diagnosis not present

## 2020-03-18 DIAGNOSIS — M21751 Unequal limb length (acquired), right femur: Secondary | ICD-10-CM | POA: Diagnosis not present

## 2020-03-18 DIAGNOSIS — M9905 Segmental and somatic dysfunction of pelvic region: Secondary | ICD-10-CM | POA: Diagnosis not present

## 2020-03-18 DIAGNOSIS — M41126 Adolescent idiopathic scoliosis, lumbar region: Secondary | ICD-10-CM | POA: Diagnosis not present

## 2020-03-18 DIAGNOSIS — M9903 Segmental and somatic dysfunction of lumbar region: Secondary | ICD-10-CM | POA: Diagnosis not present

## 2020-03-20 DIAGNOSIS — M9905 Segmental and somatic dysfunction of pelvic region: Secondary | ICD-10-CM | POA: Diagnosis not present

## 2020-03-20 DIAGNOSIS — M21751 Unequal limb length (acquired), right femur: Secondary | ICD-10-CM | POA: Diagnosis not present

## 2020-03-20 DIAGNOSIS — M41126 Adolescent idiopathic scoliosis, lumbar region: Secondary | ICD-10-CM | POA: Diagnosis not present

## 2020-03-20 DIAGNOSIS — M9903 Segmental and somatic dysfunction of lumbar region: Secondary | ICD-10-CM | POA: Diagnosis not present

## 2020-03-23 DIAGNOSIS — M9905 Segmental and somatic dysfunction of pelvic region: Secondary | ICD-10-CM | POA: Diagnosis not present

## 2020-03-23 DIAGNOSIS — M21751 Unequal limb length (acquired), right femur: Secondary | ICD-10-CM | POA: Diagnosis not present

## 2020-03-23 DIAGNOSIS — M9903 Segmental and somatic dysfunction of lumbar region: Secondary | ICD-10-CM | POA: Diagnosis not present

## 2020-03-23 DIAGNOSIS — M41126 Adolescent idiopathic scoliosis, lumbar region: Secondary | ICD-10-CM | POA: Diagnosis not present

## 2020-03-25 DIAGNOSIS — M41126 Adolescent idiopathic scoliosis, lumbar region: Secondary | ICD-10-CM | POA: Diagnosis not present

## 2020-03-25 DIAGNOSIS — M9905 Segmental and somatic dysfunction of pelvic region: Secondary | ICD-10-CM | POA: Diagnosis not present

## 2020-03-25 DIAGNOSIS — M21751 Unequal limb length (acquired), right femur: Secondary | ICD-10-CM | POA: Diagnosis not present

## 2020-03-25 DIAGNOSIS — M9903 Segmental and somatic dysfunction of lumbar region: Secondary | ICD-10-CM | POA: Diagnosis not present

## 2020-03-26 DIAGNOSIS — M41126 Adolescent idiopathic scoliosis, lumbar region: Secondary | ICD-10-CM | POA: Diagnosis not present

## 2020-03-26 DIAGNOSIS — M21751 Unequal limb length (acquired), right femur: Secondary | ICD-10-CM | POA: Diagnosis not present

## 2020-03-26 DIAGNOSIS — M9903 Segmental and somatic dysfunction of lumbar region: Secondary | ICD-10-CM | POA: Diagnosis not present

## 2020-03-26 DIAGNOSIS — M9905 Segmental and somatic dysfunction of pelvic region: Secondary | ICD-10-CM | POA: Diagnosis not present

## 2020-04-03 DIAGNOSIS — Z23 Encounter for immunization: Secondary | ICD-10-CM | POA: Diagnosis not present

## 2020-04-16 DIAGNOSIS — R69 Illness, unspecified: Secondary | ICD-10-CM | POA: Diagnosis not present

## 2020-04-30 DIAGNOSIS — R7303 Prediabetes: Secondary | ICD-10-CM | POA: Diagnosis not present

## 2020-04-30 DIAGNOSIS — I10 Essential (primary) hypertension: Secondary | ICD-10-CM | POA: Diagnosis not present

## 2020-04-30 DIAGNOSIS — E782 Mixed hyperlipidemia: Secondary | ICD-10-CM | POA: Diagnosis not present

## 2020-04-30 DIAGNOSIS — N390 Urinary tract infection, site not specified: Secondary | ICD-10-CM | POA: Diagnosis not present

## 2020-05-07 DIAGNOSIS — K5730 Diverticulosis of large intestine without perforation or abscess without bleeding: Secondary | ICD-10-CM | POA: Diagnosis not present

## 2020-05-07 DIAGNOSIS — E782 Mixed hyperlipidemia: Secondary | ICD-10-CM | POA: Diagnosis not present

## 2020-05-07 DIAGNOSIS — D1803 Hemangioma of intra-abdominal structures: Secondary | ICD-10-CM | POA: Diagnosis not present

## 2020-05-07 DIAGNOSIS — M150 Primary generalized (osteo)arthritis: Secondary | ICD-10-CM | POA: Diagnosis not present

## 2020-05-07 DIAGNOSIS — I700 Atherosclerosis of aorta: Secondary | ICD-10-CM | POA: Diagnosis not present

## 2020-05-07 DIAGNOSIS — I10 Essential (primary) hypertension: Secondary | ICD-10-CM | POA: Diagnosis not present

## 2020-05-07 DIAGNOSIS — Z0000 Encounter for general adult medical examination without abnormal findings: Secondary | ICD-10-CM | POA: Diagnosis not present

## 2020-05-07 DIAGNOSIS — R7303 Prediabetes: Secondary | ICD-10-CM | POA: Diagnosis not present

## 2020-05-26 DIAGNOSIS — D3132 Benign neoplasm of left choroid: Secondary | ICD-10-CM | POA: Diagnosis not present

## 2020-05-26 DIAGNOSIS — H53001 Unspecified amblyopia, right eye: Secondary | ICD-10-CM | POA: Diagnosis not present

## 2020-05-26 DIAGNOSIS — H35363 Drusen (degenerative) of macula, bilateral: Secondary | ICD-10-CM | POA: Diagnosis not present

## 2020-05-26 DIAGNOSIS — H5051 Esophoria: Secondary | ICD-10-CM | POA: Diagnosis not present

## 2020-05-26 DIAGNOSIS — H47391 Other disorders of optic disc, right eye: Secondary | ICD-10-CM | POA: Diagnosis not present

## 2020-07-22 DIAGNOSIS — K219 Gastro-esophageal reflux disease without esophagitis: Secondary | ICD-10-CM | POA: Diagnosis not present

## 2020-07-22 DIAGNOSIS — R059 Cough, unspecified: Secondary | ICD-10-CM | POA: Diagnosis not present

## 2020-07-30 DIAGNOSIS — E782 Mixed hyperlipidemia: Secondary | ICD-10-CM | POA: Diagnosis not present

## 2020-07-30 DIAGNOSIS — R7303 Prediabetes: Secondary | ICD-10-CM | POA: Diagnosis not present

## 2020-07-30 DIAGNOSIS — I10 Essential (primary) hypertension: Secondary | ICD-10-CM | POA: Diagnosis not present

## 2020-08-06 DIAGNOSIS — E782 Mixed hyperlipidemia: Secondary | ICD-10-CM | POA: Diagnosis not present

## 2020-08-06 DIAGNOSIS — K5901 Slow transit constipation: Secondary | ICD-10-CM | POA: Diagnosis not present

## 2020-08-06 DIAGNOSIS — K219 Gastro-esophageal reflux disease without esophagitis: Secondary | ICD-10-CM | POA: Diagnosis not present

## 2020-08-06 DIAGNOSIS — I10 Essential (primary) hypertension: Secondary | ICD-10-CM | POA: Diagnosis not present

## 2020-08-11 DIAGNOSIS — N401 Enlarged prostate with lower urinary tract symptoms: Secondary | ICD-10-CM | POA: Diagnosis not present

## 2020-08-11 DIAGNOSIS — R972 Elevated prostate specific antigen [PSA]: Secondary | ICD-10-CM | POA: Diagnosis not present

## 2020-08-11 DIAGNOSIS — R3914 Feeling of incomplete bladder emptying: Secondary | ICD-10-CM | POA: Diagnosis not present

## 2020-08-18 DIAGNOSIS — R339 Retention of urine, unspecified: Secondary | ICD-10-CM | POA: Diagnosis not present

## 2020-09-02 DIAGNOSIS — H35453 Secondary pigmentary degeneration, bilateral: Secondary | ICD-10-CM | POA: Diagnosis not present

## 2020-09-02 DIAGNOSIS — H35361 Drusen (degenerative) of macula, right eye: Secondary | ICD-10-CM | POA: Diagnosis not present

## 2020-09-02 DIAGNOSIS — H3509 Other intraretinal microvascular abnormalities: Secondary | ICD-10-CM | POA: Diagnosis not present

## 2020-09-02 DIAGNOSIS — H35351 Cystoid macular degeneration, right eye: Secondary | ICD-10-CM | POA: Diagnosis not present

## 2020-09-02 DIAGNOSIS — H47391 Other disorders of optic disc, right eye: Secondary | ICD-10-CM | POA: Diagnosis not present

## 2020-09-23 DIAGNOSIS — H52203 Unspecified astigmatism, bilateral: Secondary | ICD-10-CM | POA: Diagnosis not present

## 2020-09-23 DIAGNOSIS — Z961 Presence of intraocular lens: Secondary | ICD-10-CM | POA: Diagnosis not present

## 2020-09-23 DIAGNOSIS — D3132 Benign neoplasm of left choroid: Secondary | ICD-10-CM | POA: Diagnosis not present

## 2020-09-23 DIAGNOSIS — D3131 Benign neoplasm of right choroid: Secondary | ICD-10-CM | POA: Diagnosis not present

## 2020-10-19 DIAGNOSIS — R339 Retention of urine, unspecified: Secondary | ICD-10-CM | POA: Diagnosis not present

## 2020-11-10 DIAGNOSIS — N401 Enlarged prostate with lower urinary tract symptoms: Secondary | ICD-10-CM | POA: Diagnosis not present

## 2020-11-10 DIAGNOSIS — R3914 Feeling of incomplete bladder emptying: Secondary | ICD-10-CM | POA: Diagnosis not present

## 2020-11-10 DIAGNOSIS — R972 Elevated prostate specific antigen [PSA]: Secondary | ICD-10-CM | POA: Diagnosis not present

## 2021-01-26 DIAGNOSIS — R339 Retention of urine, unspecified: Secondary | ICD-10-CM | POA: Diagnosis not present

## 2021-03-02 DIAGNOSIS — L821 Other seborrheic keratosis: Secondary | ICD-10-CM | POA: Diagnosis not present

## 2021-03-02 DIAGNOSIS — B351 Tinea unguium: Secondary | ICD-10-CM | POA: Diagnosis not present

## 2021-03-02 DIAGNOSIS — L814 Other melanin hyperpigmentation: Secondary | ICD-10-CM | POA: Diagnosis not present

## 2021-03-02 DIAGNOSIS — D225 Melanocytic nevi of trunk: Secondary | ICD-10-CM | POA: Diagnosis not present

## 2021-03-02 DIAGNOSIS — L578 Other skin changes due to chronic exposure to nonionizing radiation: Secondary | ICD-10-CM | POA: Diagnosis not present

## 2021-03-03 DIAGNOSIS — H35351 Cystoid macular degeneration, right eye: Secondary | ICD-10-CM | POA: Diagnosis not present

## 2021-03-03 DIAGNOSIS — H34832 Tributary (branch) retinal vein occlusion, left eye, with macular edema: Secondary | ICD-10-CM | POA: Diagnosis not present

## 2021-03-03 DIAGNOSIS — H47391 Other disorders of optic disc, right eye: Secondary | ICD-10-CM | POA: Diagnosis not present

## 2021-03-03 DIAGNOSIS — H3562 Retinal hemorrhage, left eye: Secondary | ICD-10-CM | POA: Diagnosis not present

## 2021-03-11 DIAGNOSIS — H34832 Tributary (branch) retinal vein occlusion, left eye, with macular edema: Secondary | ICD-10-CM | POA: Diagnosis not present

## 2021-04-12 DIAGNOSIS — H34832 Tributary (branch) retinal vein occlusion, left eye, with macular edema: Secondary | ICD-10-CM | POA: Diagnosis not present

## 2021-04-12 DIAGNOSIS — H3562 Retinal hemorrhage, left eye: Secondary | ICD-10-CM | POA: Diagnosis not present

## 2021-04-12 DIAGNOSIS — H43392 Other vitreous opacities, left eye: Secondary | ICD-10-CM | POA: Diagnosis not present

## 2021-04-12 DIAGNOSIS — H43812 Vitreous degeneration, left eye: Secondary | ICD-10-CM | POA: Diagnosis not present

## 2021-04-12 DIAGNOSIS — H35033 Hypertensive retinopathy, bilateral: Secondary | ICD-10-CM | POA: Diagnosis not present

## 2021-04-12 DIAGNOSIS — H35363 Drusen (degenerative) of macula, bilateral: Secondary | ICD-10-CM | POA: Diagnosis not present

## 2021-04-13 DIAGNOSIS — H34832 Tributary (branch) retinal vein occlusion, left eye, with macular edema: Secondary | ICD-10-CM | POA: Diagnosis not present

## 2021-04-14 DIAGNOSIS — H43392 Other vitreous opacities, left eye: Secondary | ICD-10-CM | POA: Diagnosis not present

## 2021-04-14 DIAGNOSIS — H11432 Conjunctival hyperemia, left eye: Secondary | ICD-10-CM | POA: Diagnosis not present

## 2021-04-14 DIAGNOSIS — H43812 Vitreous degeneration, left eye: Secondary | ICD-10-CM | POA: Diagnosis not present

## 2021-04-14 DIAGNOSIS — H35362 Drusen (degenerative) of macula, left eye: Secondary | ICD-10-CM | POA: Diagnosis not present

## 2021-04-14 DIAGNOSIS — H34832 Tributary (branch) retinal vein occlusion, left eye, with macular edema: Secondary | ICD-10-CM | POA: Diagnosis not present

## 2021-05-11 DIAGNOSIS — R7303 Prediabetes: Secondary | ICD-10-CM | POA: Diagnosis not present

## 2021-05-11 DIAGNOSIS — R5383 Other fatigue: Secondary | ICD-10-CM | POA: Diagnosis not present

## 2021-05-11 DIAGNOSIS — I10 Essential (primary) hypertension: Secondary | ICD-10-CM | POA: Diagnosis not present

## 2021-05-11 DIAGNOSIS — E782 Mixed hyperlipidemia: Secondary | ICD-10-CM | POA: Diagnosis not present

## 2021-05-18 DIAGNOSIS — I700 Atherosclerosis of aorta: Secondary | ICD-10-CM | POA: Diagnosis not present

## 2021-05-18 DIAGNOSIS — E782 Mixed hyperlipidemia: Secondary | ICD-10-CM | POA: Diagnosis not present

## 2021-05-18 DIAGNOSIS — I10 Essential (primary) hypertension: Secondary | ICD-10-CM | POA: Diagnosis not present

## 2021-05-18 DIAGNOSIS — N182 Chronic kidney disease, stage 2 (mild): Secondary | ICD-10-CM | POA: Diagnosis not present

## 2021-05-18 DIAGNOSIS — R7303 Prediabetes: Secondary | ICD-10-CM | POA: Diagnosis not present

## 2021-05-18 DIAGNOSIS — Z0000 Encounter for general adult medical examination without abnormal findings: Secondary | ICD-10-CM | POA: Diagnosis not present

## 2021-05-25 DIAGNOSIS — H34832 Tributary (branch) retinal vein occlusion, left eye, with macular edema: Secondary | ICD-10-CM | POA: Diagnosis not present

## 2021-05-31 DIAGNOSIS — R339 Retention of urine, unspecified: Secondary | ICD-10-CM | POA: Diagnosis not present

## 2021-06-01 DIAGNOSIS — R3914 Feeling of incomplete bladder emptying: Secondary | ICD-10-CM | POA: Diagnosis not present

## 2021-06-01 DIAGNOSIS — N401 Enlarged prostate with lower urinary tract symptoms: Secondary | ICD-10-CM | POA: Diagnosis not present

## 2021-07-02 DIAGNOSIS — H35351 Cystoid macular degeneration, right eye: Secondary | ICD-10-CM | POA: Diagnosis not present

## 2021-07-02 DIAGNOSIS — H34832 Tributary (branch) retinal vein occlusion, left eye, with macular edema: Secondary | ICD-10-CM | POA: Diagnosis not present

## 2021-07-02 DIAGNOSIS — H35363 Drusen (degenerative) of macula, bilateral: Secondary | ICD-10-CM | POA: Diagnosis not present

## 2021-07-02 DIAGNOSIS — H3509 Other intraretinal microvascular abnormalities: Secondary | ICD-10-CM | POA: Diagnosis not present

## 2021-07-17 DIAGNOSIS — J019 Acute sinusitis, unspecified: Secondary | ICD-10-CM | POA: Diagnosis not present

## 2021-07-26 DIAGNOSIS — H6123 Impacted cerumen, bilateral: Secondary | ICD-10-CM | POA: Diagnosis not present

## 2021-08-05 DIAGNOSIS — N182 Chronic kidney disease, stage 2 (mild): Secondary | ICD-10-CM | POA: Diagnosis not present

## 2021-08-05 DIAGNOSIS — R0981 Nasal congestion: Secondary | ICD-10-CM | POA: Diagnosis not present

## 2021-08-06 DIAGNOSIS — H6983 Other specified disorders of Eustachian tube, bilateral: Secondary | ICD-10-CM | POA: Diagnosis not present

## 2021-08-06 DIAGNOSIS — R0981 Nasal congestion: Secondary | ICD-10-CM | POA: Diagnosis not present

## 2021-08-10 DIAGNOSIS — R339 Retention of urine, unspecified: Secondary | ICD-10-CM | POA: Diagnosis not present

## 2021-08-12 DIAGNOSIS — H34832 Tributary (branch) retinal vein occlusion, left eye, with macular edema: Secondary | ICD-10-CM | POA: Diagnosis not present

## 2021-08-13 DIAGNOSIS — H90A32 Mixed conductive and sensorineural hearing loss, unilateral, left ear with restricted hearing on the contralateral side: Secondary | ICD-10-CM | POA: Diagnosis not present

## 2021-08-13 DIAGNOSIS — H90A21 Sensorineural hearing loss, unilateral, right ear, with restricted hearing on the contralateral side: Secondary | ICD-10-CM | POA: Diagnosis not present

## 2021-08-17 DIAGNOSIS — H9012 Conductive hearing loss, unilateral, left ear, with unrestricted hearing on the contralateral side: Secondary | ICD-10-CM | POA: Diagnosis not present

## 2021-08-17 DIAGNOSIS — R0981 Nasal congestion: Secondary | ICD-10-CM | POA: Diagnosis not present

## 2021-08-17 DIAGNOSIS — H6522 Chronic serous otitis media, left ear: Secondary | ICD-10-CM | POA: Diagnosis not present

## 2021-09-02 DIAGNOSIS — J329 Chronic sinusitis, unspecified: Secondary | ICD-10-CM | POA: Diagnosis not present

## 2021-09-13 DIAGNOSIS — W540XXA Bitten by dog, initial encounter: Secondary | ICD-10-CM | POA: Diagnosis not present

## 2021-09-13 DIAGNOSIS — T1490XA Injury, unspecified, initial encounter: Secondary | ICD-10-CM | POA: Diagnosis not present

## 2021-09-22 DIAGNOSIS — H34832 Tributary (branch) retinal vein occlusion, left eye, with macular edema: Secondary | ICD-10-CM | POA: Diagnosis not present

## 2021-10-05 DIAGNOSIS — Z961 Presence of intraocular lens: Secondary | ICD-10-CM | POA: Diagnosis not present

## 2021-10-05 DIAGNOSIS — D3132 Benign neoplasm of left choroid: Secondary | ICD-10-CM | POA: Diagnosis not present

## 2021-10-05 DIAGNOSIS — H52203 Unspecified astigmatism, bilateral: Secondary | ICD-10-CM | POA: Diagnosis not present

## 2021-10-05 DIAGNOSIS — D3131 Benign neoplasm of right choroid: Secondary | ICD-10-CM | POA: Diagnosis not present

## 2021-10-14 DIAGNOSIS — R339 Retention of urine, unspecified: Secondary | ICD-10-CM | POA: Diagnosis not present

## 2021-11-01 DIAGNOSIS — H34832 Tributary (branch) retinal vein occlusion, left eye, with macular edema: Secondary | ICD-10-CM | POA: Diagnosis not present

## 2021-11-29 DIAGNOSIS — H34832 Tributary (branch) retinal vein occlusion, left eye, with macular edema: Secondary | ICD-10-CM | POA: Diagnosis not present

## 2021-11-29 DIAGNOSIS — H35363 Drusen (degenerative) of macula, bilateral: Secondary | ICD-10-CM | POA: Diagnosis not present

## 2021-11-29 DIAGNOSIS — H35453 Secondary pigmentary degeneration, bilateral: Secondary | ICD-10-CM | POA: Diagnosis not present

## 2021-11-29 DIAGNOSIS — H353131 Nonexudative age-related macular degeneration, bilateral, early dry stage: Secondary | ICD-10-CM | POA: Diagnosis not present

## 2021-12-01 DIAGNOSIS — H34832 Tributary (branch) retinal vein occlusion, left eye, with macular edema: Secondary | ICD-10-CM | POA: Diagnosis not present

## 2021-12-07 DIAGNOSIS — R339 Retention of urine, unspecified: Secondary | ICD-10-CM | POA: Diagnosis not present

## 2021-12-08 DIAGNOSIS — I10 Essential (primary) hypertension: Secondary | ICD-10-CM | POA: Diagnosis not present

## 2021-12-08 DIAGNOSIS — R7303 Prediabetes: Secondary | ICD-10-CM | POA: Diagnosis not present

## 2021-12-08 DIAGNOSIS — R0602 Shortness of breath: Secondary | ICD-10-CM | POA: Diagnosis not present

## 2021-12-08 DIAGNOSIS — R600 Localized edema: Secondary | ICD-10-CM | POA: Diagnosis not present

## 2021-12-16 DIAGNOSIS — R600 Localized edema: Secondary | ICD-10-CM | POA: Diagnosis not present

## 2021-12-16 DIAGNOSIS — R0602 Shortness of breath: Secondary | ICD-10-CM | POA: Diagnosis not present

## 2021-12-23 DIAGNOSIS — R0981 Nasal congestion: Secondary | ICD-10-CM | POA: Diagnosis not present

## 2021-12-23 DIAGNOSIS — R053 Chronic cough: Secondary | ICD-10-CM | POA: Diagnosis not present

## 2021-12-27 DIAGNOSIS — I10 Essential (primary) hypertension: Secondary | ICD-10-CM | POA: Diagnosis not present

## 2021-12-27 DIAGNOSIS — R7303 Prediabetes: Secondary | ICD-10-CM | POA: Diagnosis not present

## 2021-12-27 DIAGNOSIS — R600 Localized edema: Secondary | ICD-10-CM | POA: Diagnosis not present

## 2021-12-30 DIAGNOSIS — R3914 Feeling of incomplete bladder emptying: Secondary | ICD-10-CM | POA: Diagnosis not present

## 2021-12-30 DIAGNOSIS — R972 Elevated prostate specific antigen [PSA]: Secondary | ICD-10-CM | POA: Diagnosis not present

## 2021-12-30 DIAGNOSIS — N401 Enlarged prostate with lower urinary tract symptoms: Secondary | ICD-10-CM | POA: Diagnosis not present

## 2022-01-04 DIAGNOSIS — R0981 Nasal congestion: Secondary | ICD-10-CM | POA: Diagnosis not present

## 2022-01-04 DIAGNOSIS — R053 Chronic cough: Secondary | ICD-10-CM | POA: Diagnosis not present

## 2022-01-04 DIAGNOSIS — J341 Cyst and mucocele of nose and nasal sinus: Secondary | ICD-10-CM | POA: Diagnosis not present

## 2022-01-04 DIAGNOSIS — J342 Deviated nasal septum: Secondary | ICD-10-CM | POA: Diagnosis not present

## 2022-01-05 DIAGNOSIS — H34832 Tributary (branch) retinal vein occlusion, left eye, with macular edema: Secondary | ICD-10-CM | POA: Diagnosis not present

## 2022-01-31 DIAGNOSIS — I10 Essential (primary) hypertension: Secondary | ICD-10-CM | POA: Diagnosis not present

## 2022-01-31 DIAGNOSIS — B351 Tinea unguium: Secondary | ICD-10-CM | POA: Diagnosis not present

## 2022-01-31 DIAGNOSIS — Z809 Family history of malignant neoplasm, unspecified: Secondary | ICD-10-CM | POA: Diagnosis not present

## 2022-01-31 DIAGNOSIS — N400 Enlarged prostate without lower urinary tract symptoms: Secondary | ICD-10-CM | POA: Diagnosis not present

## 2022-02-04 DIAGNOSIS — R339 Retention of urine, unspecified: Secondary | ICD-10-CM | POA: Diagnosis not present

## 2022-02-04 DIAGNOSIS — H34832 Tributary (branch) retinal vein occlusion, left eye, with macular edema: Secondary | ICD-10-CM | POA: Diagnosis not present

## 2022-02-08 DIAGNOSIS — R3914 Feeling of incomplete bladder emptying: Secondary | ICD-10-CM | POA: Diagnosis not present

## 2022-02-08 DIAGNOSIS — N401 Enlarged prostate with lower urinary tract symptoms: Secondary | ICD-10-CM | POA: Diagnosis not present

## 2022-02-23 DIAGNOSIS — R140 Abdominal distension (gaseous): Secondary | ICD-10-CM | POA: Diagnosis not present

## 2022-02-23 DIAGNOSIS — R197 Diarrhea, unspecified: Secondary | ICD-10-CM | POA: Diagnosis not present

## 2022-02-23 DIAGNOSIS — R194 Change in bowel habit: Secondary | ICD-10-CM | POA: Diagnosis not present

## 2022-03-08 DIAGNOSIS — L578 Other skin changes due to chronic exposure to nonionizing radiation: Secondary | ICD-10-CM | POA: Diagnosis not present

## 2022-03-08 DIAGNOSIS — D225 Melanocytic nevi of trunk: Secondary | ICD-10-CM | POA: Diagnosis not present

## 2022-03-08 DIAGNOSIS — B351 Tinea unguium: Secondary | ICD-10-CM | POA: Diagnosis not present

## 2022-03-08 DIAGNOSIS — L821 Other seborrheic keratosis: Secondary | ICD-10-CM | POA: Diagnosis not present

## 2022-03-08 DIAGNOSIS — L814 Other melanin hyperpigmentation: Secondary | ICD-10-CM | POA: Diagnosis not present

## 2022-03-21 DIAGNOSIS — R197 Diarrhea, unspecified: Secondary | ICD-10-CM | POA: Diagnosis not present

## 2022-03-23 DIAGNOSIS — J310 Chronic rhinitis: Secondary | ICD-10-CM | POA: Diagnosis not present

## 2022-03-23 DIAGNOSIS — R911 Solitary pulmonary nodule: Secondary | ICD-10-CM | POA: Diagnosis not present

## 2022-04-06 DIAGNOSIS — H34832 Tributary (branch) retinal vein occlusion, left eye, with macular edema: Secondary | ICD-10-CM | POA: Diagnosis not present

## 2022-04-19 DIAGNOSIS — R339 Retention of urine, unspecified: Secondary | ICD-10-CM | POA: Diagnosis not present

## 2022-04-25 DIAGNOSIS — T148XXA Other injury of unspecified body region, initial encounter: Secondary | ICD-10-CM | POA: Diagnosis not present

## 2022-05-03 DIAGNOSIS — H6993 Unspecified Eustachian tube disorder, bilateral: Secondary | ICD-10-CM | POA: Diagnosis not present

## 2022-05-03 DIAGNOSIS — K219 Gastro-esophageal reflux disease without esophagitis: Secondary | ICD-10-CM | POA: Diagnosis not present

## 2022-05-03 DIAGNOSIS — H9012 Conductive hearing loss, unilateral, left ear, with unrestricted hearing on the contralateral side: Secondary | ICD-10-CM | POA: Diagnosis not present

## 2022-05-03 DIAGNOSIS — R0981 Nasal congestion: Secondary | ICD-10-CM | POA: Diagnosis not present

## 2022-05-06 DIAGNOSIS — B338 Other specified viral diseases: Secondary | ICD-10-CM | POA: Diagnosis not present

## 2022-05-06 DIAGNOSIS — H34832 Tributary (branch) retinal vein occlusion, left eye, with macular edema: Secondary | ICD-10-CM | POA: Diagnosis not present

## 2022-05-10 ENCOUNTER — Ambulatory Visit (INDEPENDENT_AMBULATORY_CARE_PROVIDER_SITE_OTHER): Payer: Medicare HMO

## 2022-05-10 ENCOUNTER — Ambulatory Visit (INDEPENDENT_AMBULATORY_CARE_PROVIDER_SITE_OTHER): Payer: Medicare HMO | Admitting: Orthopaedic Surgery

## 2022-05-10 DIAGNOSIS — R2231 Localized swelling, mass and lump, right upper limb: Secondary | ICD-10-CM | POA: Insufficient documentation

## 2022-05-10 DIAGNOSIS — M79601 Pain in right arm: Secondary | ICD-10-CM | POA: Diagnosis not present

## 2022-05-10 NOTE — Progress Notes (Signed)
Office Visit Note   Patient: Raymond Holland           Date of Birth: 1933-09-26           MRN: [id number] Visit Date: 05/10/2022              Requested by: Merrilee Seashore, [address],   [id number] PCP: Merrilee Seashore, MD   Assessment & Plan: Visit Diagnoses:  1. Mass of right forearm     Plan: Impression is right forearm mass.  X-rays are negative for bony involvement.  Based on treatment options he would like to move forward with an MRI for better characterization of the mass to determine options.  Follow-up after the MRI.  Follow-Up Instructions: No follow-ups on file.   Orders:  Orders Placed This Encounter  Procedures   XR Forearm Right   MR FOREARM RIGHT W CONTRAST   No orders of the defined types were placed in this encounter.     Procedures: No procedures performed   Clinical Data: No additional findings.   Subjective: Chief Complaint  Patient presents with   Right Forearm - Pain    HPI Raymond Holland is a very pleasant 88 year old gentleman here for evaluation for a mass on his right forearm that he noticed earlier this month.  Denies any constitutional symptoms or previous injuries.  He has not noticed any rapid changes in size.  It is not symptomatic.  Review of Systems  Constitutional: Negative.   All other systems reviewed and are negative.    Objective: Vital Signs: There were no vitals taken for this visit.  Physical Exam Vitals and nursing note reviewed.  Constitutional:      Appearance: He is well-developed.  HENT:     Head: Normocephalic and atraumatic.  Eyes:     Pupils: Pupils are equal, round, and reactive to light.  Pulmonary:     Effort: Pulmonary effort is normal.  Abdominal:     Palpations: Abdomen is soft.  Musculoskeletal:        General: Normal range of motion.     Cervical back: Neck supple.  Skin:    General: Skin is warm.  Neurological:     Mental Status: He is alert and  oriented to person, place, and time.  Psychiatric:        Behavior: Behavior normal.        Thought Content: Thought content normal.        Judgment: Judgment normal.     Ortho Exam Examination of the right forearm shows a firm semimobile soft tissue mass on the radial dorsal aspect of the midforearm.  Overlying skin is normal.  There is no fluctuance.  He has normal motor and sensory function to the upper extremity.  Specialty Comments:  No specialty comments available.  Imaging: XR Forearm Right  Result Date: 05/10/2022 No acute or structural abnormalities.  There is a soft tissues density overlying the mid forearm.    PMFS History: Patient Active Problem List   Diagnosis Date Noted   Mass of right forearm 05/10/2022   Past Medical History:  Diagnosis Date   Arthritis    Diverticulosis    Headache    migraines- none recent   History of seasonal allergies    Flonase if needed   Hyperlipidemia    Hypertension    well controlled with meds   LBP (low back pain)    Liver hemangioma    MRI '12 Epic  No family history on file.  Past Surgical History:  Procedure Laterality Date   CHOLECYSTECTOMY N/A 10/13/2014   Procedure: LAPAROSCOPIC CHOLECYSTECTOMY WITH INTRAOPERATIVE CHOLANGIOGRAM;  Surgeon: Greer Pickerel, MD;  Location: [facility];  Service: General;  Laterality: N/A;   COLONOSCOPY     EUS N/A 03/25/2016   Procedure: UPPER ENDOSCOPIC ULTRASOUND (EUS) LINEAR;  Surgeon: Carol Ada, MD;  Location: [facility];  Service: Endoscopy;  Laterality: N/A;   Social History   Occupational History   Not on file  Tobacco Use   Smoking status: Never   Smokeless tobacco: Never  Substance and Sexual Activity   Alcohol use: Yes    Alcohol/week: 0.0 standard drinks of alcohol    Comment: 1 1/2 glass wine daily   Drug use: No   Sexual activity: Not on file

## 2022-05-16 DIAGNOSIS — H35351 Cystoid macular degeneration, right eye: Secondary | ICD-10-CM | POA: Diagnosis not present

## 2022-05-16 DIAGNOSIS — H35363 Drusen (degenerative) of macula, bilateral: Secondary | ICD-10-CM | POA: Diagnosis not present

## 2022-05-16 DIAGNOSIS — H353131 Nonexudative age-related macular degeneration, bilateral, early dry stage: Secondary | ICD-10-CM | POA: Diagnosis not present

## 2022-05-16 DIAGNOSIS — H34832 Tributary (branch) retinal vein occlusion, left eye, with macular edema: Secondary | ICD-10-CM | POA: Diagnosis not present

## 2022-05-16 DIAGNOSIS — D3132 Benign neoplasm of left choroid: Secondary | ICD-10-CM | POA: Diagnosis not present

## 2022-05-16 DIAGNOSIS — H47391 Other disorders of optic disc, right eye: Secondary | ICD-10-CM | POA: Diagnosis not present

## 2022-05-17 DIAGNOSIS — R7303 Prediabetes: Secondary | ICD-10-CM | POA: Diagnosis not present

## 2022-05-17 DIAGNOSIS — E782 Mixed hyperlipidemia: Secondary | ICD-10-CM | POA: Diagnosis not present

## 2022-05-17 DIAGNOSIS — R5383 Other fatigue: Secondary | ICD-10-CM | POA: Diagnosis not present

## 2022-05-17 DIAGNOSIS — I10 Essential (primary) hypertension: Secondary | ICD-10-CM | POA: Diagnosis not present

## 2022-05-24 DIAGNOSIS — J3489 Other specified disorders of nose and nasal sinuses: Secondary | ICD-10-CM | POA: Diagnosis not present

## 2022-05-24 DIAGNOSIS — N182 Chronic kidney disease, stage 2 (mild): Secondary | ICD-10-CM | POA: Diagnosis not present

## 2022-05-24 DIAGNOSIS — I10 Essential (primary) hypertension: Secondary | ICD-10-CM | POA: Diagnosis not present

## 2022-05-24 DIAGNOSIS — Z0000 Encounter for general adult medical examination without abnormal findings: Secondary | ICD-10-CM | POA: Diagnosis not present

## 2022-05-24 DIAGNOSIS — I700 Atherosclerosis of aorta: Secondary | ICD-10-CM | POA: Diagnosis not present

## 2022-05-24 DIAGNOSIS — E782 Mixed hyperlipidemia: Secondary | ICD-10-CM | POA: Diagnosis not present

## 2022-05-24 DIAGNOSIS — R7303 Prediabetes: Secondary | ICD-10-CM | POA: Diagnosis not present

## 2022-06-07 DIAGNOSIS — R972 Elevated prostate specific antigen [PSA]: Secondary | ICD-10-CM | POA: Diagnosis not present

## 2022-06-07 DIAGNOSIS — N401 Enlarged prostate with lower urinary tract symptoms: Secondary | ICD-10-CM | POA: Diagnosis not present

## 2022-06-07 DIAGNOSIS — R3914 Feeling of incomplete bladder emptying: Secondary | ICD-10-CM | POA: Diagnosis not present

## 2022-06-08 ENCOUNTER — Ambulatory Visit
Admission: RE | Admit: 2022-06-08 | Discharge: 2022-06-08 | Disposition: A | Discharge status: Home / Self Care | Payer: Medicare HMO | Source: Ambulatory Visit | Attending: Orthopaedic Surgery | Admitting: Orthopaedic Surgery

## 2022-06-08 DIAGNOSIS — R2231 Localized swelling, mass and lump, right upper limb: Secondary | ICD-10-CM | POA: Diagnosis not present

## 2022-06-08 MED ORDER — GADOPICLENOL 0.5 MMOL/ML IV SOLN
7.0000 mL | Freq: Once | INTRAVENOUS | Status: AC | PRN
  Administered 2022-06-08: 7 mL via INTRAVENOUS

## 2022-06-09 ENCOUNTER — Telehealth: Payer: Self-pay | Admitting: Orthopaedic Surgery

## 2022-06-09 DIAGNOSIS — H34832 Tributary (branch) retinal vein occlusion, left eye, with macular edema: Secondary | ICD-10-CM | POA: Diagnosis not present

## 2022-06-09 NOTE — Telephone Encounter (Signed)
MRI done yesterday and impression number 1 is what they want to focus on per [facility] radiology  1. Well-defined fat containing mass within the superficial aspect of the distal right brachioradialis muscle measuring 4.1 x 1.1 x 1.8 cm. Mass contains a 2.2 cm enhancing nodule. 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

## 2022-06-09 NOTE — Progress Notes (Signed)
He needs asap referral to [place], cynthia emory at [place].  Reason is liposarcoma of the forearm.  Thank you.

## 2022-06-10 ENCOUNTER — Other Ambulatory Visit: Payer: Self-pay

## 2022-06-10 DIAGNOSIS — R2231 Localized swelling, mass and lump, right upper limb: Secondary | ICD-10-CM

## 2022-06-14 DIAGNOSIS — R339 Retention of urine, unspecified: Secondary | ICD-10-CM | POA: Diagnosis not present

## 2022-06-16 ENCOUNTER — Ambulatory Visit: Payer: Medicare HMO | Admitting: Orthopaedic Surgery

## 2022-06-16 DIAGNOSIS — R2231 Localized swelling, mass and lump, right upper limb: Secondary | ICD-10-CM | POA: Diagnosis not present

## 2022-06-16 NOTE — Progress Notes (Signed)
   Office Visit Note   Patient: Raymond Holland           Date of Birth: [birth year]           MRN: [id number] Visit Date: 06/16/2022              Requested by: Merrilee Seashore, [address] [place],  [place] [address] PCP: Merrilee Seashore, MD   Assessment & Plan: Visit Diagnoses:  1. 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  At this point, urgent referral has been made to Dr. Magda Bernheim at [facility].  Patient was also provided with this information.  He will follow-up with [place] as needed.  Follow-Up Instructions: Return if symptoms worsen or fail to improve.   Orders:  No orders of the defined types were placed in this encounter.  No orders of the defined types were placed in this encounter.     Procedures: No procedures performed   Clinical Data: No additional findings.   Subjective: Chief Complaint  Patient presents with   Right Forearm - Follow-up    HPI patient is a pleasant 88 year old gentleman who comes in today to review MRI results of the right forearm.  He noticed a right forearm mass about 2 months ago.  He was seen by his primary care provider who eventually referred him to [place] and we subsequently ordered an MRI of the right forearm.  Right forearm MRI shows a mass within the brachial radialis muscle suspicious for an intramuscular lipomatous tumor/liposarcoma.  Patient denies any change in size color or tenderness of this area since noticing this in October.  He denies any fevers or chills or any other systemic symptoms.  No history of cancer.     Objective: Vital Signs: There were no vitals taken for this visit.    Ortho Exam no change in right forearm mass  Specialty Comments:  No specialty comments available.  Imaging: No new imaging   PMFS History: Patient Active Problem List   Diagnosis Date Noted   Mass of right forearm  05/10/2022   Past Medical History:  Diagnosis Date   Arthritis    Diverticulosis    Headache    migraines- none recent   History of seasonal allergies    Flonase if needed   Hyperlipidemia    Hypertension    well controlled with meds   LBP (low back pain)    Liver hemangioma    MRI '12 Epic    No family history on file.  Past Surgical History:  Procedure Laterality Date   CHOLECYSTECTOMY N/A 10/13/2014   Procedure: LAPAROSCOPIC CHOLECYSTECTOMY WITH INTRAOPERATIVE CHOLANGIOGRAM;  Surgeon: Greer Pickerel, MD;  Location: [facility];  Service: General;  Laterality: N/A;   COLONOSCOPY     EUS N/A 03/25/2016   Procedure: UPPER ENDOSCOPIC ULTRASOUND (EUS) LINEAR;  Surgeon: Carol Ada, MD;  Location: [facility];  Service: Endoscopy;  Laterality: N/A;   Social History   Occupational History   Not on file  Tobacco Use   Smoking status: Never   Smokeless tobacco: Never  Substance and Sexual Activity   Alcohol use: Yes    Alcohol/week: 0.0 standard drinks of alcohol    Comment: 1 1/2 glass wine daily   Drug use: No   Sexual activity: Not on file

## 2022-06-17 ENCOUNTER — Telehealth: Payer: Self-pay | Admitting: Orthopaedic Surgery

## 2022-06-17 NOTE — Telephone Encounter (Signed)
Received call from patient. Dr. Erlinda Hong has referred him to Dr. Magda Bernheim. Needs medical records faxed and MRI on CD. I advised him he will need to get MRI from [place] where he had done. I faxed records [phone]

## 2022-06-17 NOTE — Telephone Encounter (Signed)
Patient need a copy of MRI on a dis to take a different Dr before Tuesday morning. Contact patient when it is  ready at [phone]

## 2022-06-21 DIAGNOSIS — R2231 Localized swelling, mass and lump, right upper limb: Secondary | ICD-10-CM | POA: Diagnosis not present

## 2022-06-21 DIAGNOSIS — M7989 Other specified soft tissue disorders: Secondary | ICD-10-CM | POA: Diagnosis not present

## 2022-06-23 DIAGNOSIS — H6993 Unspecified Eustachian tube disorder, bilateral: Secondary | ICD-10-CM | POA: Diagnosis not present

## 2022-06-23 DIAGNOSIS — H9012 Conductive hearing loss, unilateral, left ear, with unrestricted hearing on the contralateral side: Secondary | ICD-10-CM | POA: Diagnosis not present

## 2022-06-30 DIAGNOSIS — H90A32 Mixed conductive and sensorineural hearing loss, unilateral, left ear with restricted hearing on the contralateral side: Secondary | ICD-10-CM | POA: Diagnosis not present

## 2022-07-20 DIAGNOSIS — H34832 Tributary (branch) retinal vein occlusion, left eye, with macular edema: Secondary | ICD-10-CM | POA: Diagnosis not present

## 2022-07-27 DIAGNOSIS — H90A32 Mixed conductive and sensorineural hearing loss, unilateral, left ear with restricted hearing on the contralateral side: Secondary | ICD-10-CM | POA: Diagnosis not present

## 2022-07-28 DIAGNOSIS — H9012 Conductive hearing loss, unilateral, left ear, with unrestricted hearing on the contralateral side: Secondary | ICD-10-CM | POA: Diagnosis not present

## 2022-08-23 DIAGNOSIS — H34832 Tributary (branch) retinal vein occlusion, left eye, with macular edema: Secondary | ICD-10-CM | POA: Diagnosis not present

## 2022-09-07 DIAGNOSIS — R339 Retention of urine, unspecified: Secondary | ICD-10-CM | POA: Diagnosis not present

## 2022-09-27 DIAGNOSIS — H34832 Tributary (branch) retinal vein occlusion, left eye, with macular edema: Secondary | ICD-10-CM | POA: Diagnosis not present

## 2022-10-11 DIAGNOSIS — M7989 Other specified soft tissue disorders: Secondary | ICD-10-CM | POA: Diagnosis not present

## 2022-10-12 DIAGNOSIS — R339 Retention of urine, unspecified: Secondary | ICD-10-CM | POA: Diagnosis not present

## 2022-10-26 DIAGNOSIS — D3132 Benign neoplasm of left choroid: Secondary | ICD-10-CM | POA: Diagnosis not present

## 2022-10-26 DIAGNOSIS — H52203 Unspecified astigmatism, bilateral: Secondary | ICD-10-CM | POA: Diagnosis not present

## 2022-10-26 DIAGNOSIS — D3131 Benign neoplasm of right choroid: Secondary | ICD-10-CM | POA: Diagnosis not present

## 2022-10-26 DIAGNOSIS — H53001 Unspecified amblyopia, right eye: Secondary | ICD-10-CM | POA: Diagnosis not present

## 2022-10-26 DIAGNOSIS — Z961 Presence of intraocular lens: Secondary | ICD-10-CM | POA: Diagnosis not present

## 2022-11-02 DIAGNOSIS — H34832 Tributary (branch) retinal vein occlusion, left eye, with macular edema: Secondary | ICD-10-CM | POA: Diagnosis not present

## 2022-11-22 DIAGNOSIS — H35033 Hypertensive retinopathy, bilateral: Secondary | ICD-10-CM | POA: Diagnosis not present

## 2022-11-22 DIAGNOSIS — H35363 Drusen (degenerative) of macula, bilateral: Secondary | ICD-10-CM | POA: Diagnosis not present

## 2022-11-22 DIAGNOSIS — H353131 Nonexudative age-related macular degeneration, bilateral, early dry stage: Secondary | ICD-10-CM | POA: Diagnosis not present

## 2022-11-22 DIAGNOSIS — H35453 Secondary pigmentary degeneration, bilateral: Secondary | ICD-10-CM | POA: Diagnosis not present

## 2022-11-22 DIAGNOSIS — D3132 Benign neoplasm of left choroid: Secondary | ICD-10-CM | POA: Diagnosis not present

## 2022-11-22 DIAGNOSIS — H34832 Tributary (branch) retinal vein occlusion, left eye, with macular edema: Secondary | ICD-10-CM | POA: Diagnosis not present

## 2022-11-29 DIAGNOSIS — I10 Essential (primary) hypertension: Secondary | ICD-10-CM | POA: Diagnosis not present

## 2022-11-29 DIAGNOSIS — R7303 Prediabetes: Secondary | ICD-10-CM | POA: Diagnosis not present

## 2022-11-29 DIAGNOSIS — E782 Mixed hyperlipidemia: Secondary | ICD-10-CM | POA: Diagnosis not present

## 2022-11-29 DIAGNOSIS — N182 Chronic kidney disease, stage 2 (mild): Secondary | ICD-10-CM | POA: Diagnosis not present

## 2022-12-02 DIAGNOSIS — H34832 Tributary (branch) retinal vein occlusion, left eye, with macular edema: Secondary | ICD-10-CM | POA: Diagnosis not present

## 2022-12-06 DIAGNOSIS — I700 Atherosclerosis of aorta: Secondary | ICD-10-CM | POA: Diagnosis not present

## 2022-12-06 DIAGNOSIS — N182 Chronic kidney disease, stage 2 (mild): Secondary | ICD-10-CM | POA: Diagnosis not present

## 2022-12-06 DIAGNOSIS — E782 Mixed hyperlipidemia: Secondary | ICD-10-CM | POA: Diagnosis not present

## 2022-12-06 DIAGNOSIS — R7303 Prediabetes: Secondary | ICD-10-CM | POA: Diagnosis not present

## 2022-12-06 DIAGNOSIS — I10 Essential (primary) hypertension: Secondary | ICD-10-CM | POA: Diagnosis not present

## 2022-12-06 DIAGNOSIS — Z23 Encounter for immunization: Secondary | ICD-10-CM | POA: Diagnosis not present

## 2022-12-07 DIAGNOSIS — R339 Retention of urine, unspecified: Secondary | ICD-10-CM | POA: Diagnosis not present

## 2023-01-02 DIAGNOSIS — H34832 Tributary (branch) retinal vein occlusion, left eye, with macular edema: Secondary | ICD-10-CM | POA: Diagnosis not present

## 2023-01-06 DIAGNOSIS — R339 Retention of urine, unspecified: Secondary | ICD-10-CM | POA: Diagnosis not present

## 2023-01-25 DIAGNOSIS — Z0100 Encounter for examination of eyes and vision without abnormal findings: Secondary | ICD-10-CM | POA: Diagnosis not present

## 2023-02-01 DIAGNOSIS — H34832 Tributary (branch) retinal vein occlusion, left eye, with macular edema: Secondary | ICD-10-CM | POA: Diagnosis not present

## 2023-03-08 DIAGNOSIS — H34832 Tributary (branch) retinal vein occlusion, left eye, with macular edema: Secondary | ICD-10-CM | POA: Diagnosis not present

## 2023-03-09 DIAGNOSIS — Z974 Presence of external hearing-aid: Secondary | ICD-10-CM | POA: Diagnosis not present

## 2023-03-09 DIAGNOSIS — N400 Enlarged prostate without lower urinary tract symptoms: Secondary | ICD-10-CM | POA: Diagnosis not present

## 2023-03-09 DIAGNOSIS — M1990 Unspecified osteoarthritis, unspecified site: Secondary | ICD-10-CM | POA: Diagnosis not present

## 2023-03-09 DIAGNOSIS — N529 Male erectile dysfunction, unspecified: Secondary | ICD-10-CM | POA: Diagnosis not present

## 2023-03-09 DIAGNOSIS — Z85828 Personal history of other malignant neoplasm of skin: Secondary | ICD-10-CM | POA: Diagnosis not present

## 2023-03-09 DIAGNOSIS — Z973 Presence of spectacles and contact lenses: Secondary | ICD-10-CM | POA: Diagnosis not present

## 2023-03-09 DIAGNOSIS — H409 Unspecified glaucoma: Secondary | ICD-10-CM | POA: Diagnosis not present

## 2023-03-09 DIAGNOSIS — I10 Essential (primary) hypertension: Secondary | ICD-10-CM | POA: Diagnosis not present

## 2023-03-09 DIAGNOSIS — H348392 Tributary (branch) retinal vein occlusion, unspecified eye, stable: Secondary | ICD-10-CM | POA: Diagnosis not present

## 2023-03-22 DIAGNOSIS — L578 Other skin changes due to chronic exposure to nonionizing radiation: Secondary | ICD-10-CM | POA: Diagnosis not present

## 2023-03-22 DIAGNOSIS — L821 Other seborrheic keratosis: Secondary | ICD-10-CM | POA: Diagnosis not present

## 2023-03-22 DIAGNOSIS — L814 Other melanin hyperpigmentation: Secondary | ICD-10-CM | POA: Diagnosis not present

## 2023-03-22 DIAGNOSIS — D225 Melanocytic nevi of trunk: Secondary | ICD-10-CM | POA: Diagnosis not present

## 2023-04-04 DIAGNOSIS — R339 Retention of urine, unspecified: Secondary | ICD-10-CM | POA: Diagnosis not present

## 2023-04-10 DIAGNOSIS — H34832 Tributary (branch) retinal vein occlusion, left eye, with macular edema: Secondary | ICD-10-CM | POA: Diagnosis not present

## 2023-05-04 DIAGNOSIS — R339 Retention of urine, unspecified: Secondary | ICD-10-CM | POA: Diagnosis not present

## 2023-05-09 DIAGNOSIS — R2231 Localized swelling, mass and lump, right upper limb: Secondary | ICD-10-CM | POA: Diagnosis not present

## 2023-05-23 DIAGNOSIS — H34832 Tributary (branch) retinal vein occlusion, left eye, with macular edema: Secondary | ICD-10-CM | POA: Diagnosis not present

## 2023-06-05 DIAGNOSIS — D1721 Benign lipomatous neoplasm of skin and subcutaneous tissue of right arm: Secondary | ICD-10-CM | POA: Diagnosis not present

## 2023-06-05 DIAGNOSIS — R2231 Localized swelling, mass and lump, right upper limb: Secondary | ICD-10-CM | POA: Diagnosis not present

## 2023-06-05 DIAGNOSIS — R339 Retention of urine, unspecified: Secondary | ICD-10-CM | POA: Diagnosis not present

## 2023-06-06 DIAGNOSIS — R5383 Other fatigue: Secondary | ICD-10-CM | POA: Diagnosis not present

## 2023-06-06 DIAGNOSIS — E782 Mixed hyperlipidemia: Secondary | ICD-10-CM | POA: Diagnosis not present

## 2023-06-06 DIAGNOSIS — I10 Essential (primary) hypertension: Secondary | ICD-10-CM | POA: Diagnosis not present

## 2023-06-06 DIAGNOSIS — N182 Chronic kidney disease, stage 2 (mild): Secondary | ICD-10-CM | POA: Diagnosis not present

## 2023-06-06 DIAGNOSIS — I700 Atherosclerosis of aorta: Secondary | ICD-10-CM | POA: Diagnosis not present

## 2023-06-06 DIAGNOSIS — R7303 Prediabetes: Secondary | ICD-10-CM | POA: Diagnosis not present

## 2023-06-12 DIAGNOSIS — N401 Enlarged prostate with lower urinary tract symptoms: Secondary | ICD-10-CM | POA: Diagnosis not present

## 2023-06-12 DIAGNOSIS — R972 Elevated prostate specific antigen [PSA]: Secondary | ICD-10-CM | POA: Diagnosis not present

## 2023-06-12 DIAGNOSIS — R3914 Feeling of incomplete bladder emptying: Secondary | ICD-10-CM | POA: Diagnosis not present

## 2023-06-13 DIAGNOSIS — I700 Atherosclerosis of aorta: Secondary | ICD-10-CM | POA: Diagnosis not present

## 2023-06-13 DIAGNOSIS — E782 Mixed hyperlipidemia: Secondary | ICD-10-CM | POA: Diagnosis not present

## 2023-06-13 DIAGNOSIS — N182 Chronic kidney disease, stage 2 (mild): Secondary | ICD-10-CM | POA: Diagnosis not present

## 2023-06-13 DIAGNOSIS — I10 Essential (primary) hypertension: Secondary | ICD-10-CM | POA: Diagnosis not present

## 2023-06-13 DIAGNOSIS — R7303 Prediabetes: Secondary | ICD-10-CM | POA: Diagnosis not present

## 2023-06-13 DIAGNOSIS — Z0000 Encounter for general adult medical examination without abnormal findings: Secondary | ICD-10-CM | POA: Diagnosis not present

## 2023-06-20 DIAGNOSIS — Z4789 Encounter for other orthopedic aftercare: Secondary | ICD-10-CM | POA: Diagnosis not present

## 2023-07-19 DIAGNOSIS — H35351 Cystoid macular degeneration, right eye: Secondary | ICD-10-CM | POA: Diagnosis not present

## 2023-07-19 DIAGNOSIS — H47091 Other disorders of optic nerve, not elsewhere classified, right eye: Secondary | ICD-10-CM | POA: Diagnosis not present

## 2023-07-19 DIAGNOSIS — H34832 Tributary (branch) retinal vein occlusion, left eye, with macular edema: Secondary | ICD-10-CM | POA: Diagnosis not present

## 2023-08-07 DIAGNOSIS — R339 Retention of urine, unspecified: Secondary | ICD-10-CM | POA: Diagnosis not present

## 2023-08-24 DIAGNOSIS — H3581 Retinal edema: Secondary | ICD-10-CM | POA: Diagnosis not present

## 2023-08-24 DIAGNOSIS — H34832 Tributary (branch) retinal vein occlusion, left eye, with macular edema: Secondary | ICD-10-CM | POA: Diagnosis not present

## 2023-08-24 DIAGNOSIS — H47391 Other disorders of optic disc, right eye: Secondary | ICD-10-CM | POA: Diagnosis not present

## 2023-09-06 DIAGNOSIS — R339 Retention of urine, unspecified: Secondary | ICD-10-CM | POA: Diagnosis not present

## 2023-10-02 DIAGNOSIS — H34832 Tributary (branch) retinal vein occlusion, left eye, with macular edema: Secondary | ICD-10-CM | POA: Diagnosis not present

## 2023-11-01 DIAGNOSIS — H35352 Cystoid macular degeneration, left eye: Secondary | ICD-10-CM | POA: Diagnosis not present

## 2023-11-01 DIAGNOSIS — D3131 Benign neoplasm of right choroid: Secondary | ICD-10-CM | POA: Diagnosis not present

## 2023-11-01 DIAGNOSIS — D3132 Benign neoplasm of left choroid: Secondary | ICD-10-CM | POA: Diagnosis not present

## 2023-11-01 DIAGNOSIS — H52203 Unspecified astigmatism, bilateral: Secondary | ICD-10-CM | POA: Diagnosis not present

## 2023-11-01 DIAGNOSIS — Z961 Presence of intraocular lens: Secondary | ICD-10-CM | POA: Diagnosis not present

## 2023-11-09 DIAGNOSIS — R339 Retention of urine, unspecified: Secondary | ICD-10-CM | POA: Diagnosis not present

## 2023-12-11 DIAGNOSIS — H34832 Tributary (branch) retinal vein occlusion, left eye, with macular edema: Secondary | ICD-10-CM | POA: Diagnosis not present

## 2024-01-25 DIAGNOSIS — R7303 Prediabetes: Secondary | ICD-10-CM | POA: Diagnosis not present

## 2024-01-25 DIAGNOSIS — E782 Mixed hyperlipidemia: Secondary | ICD-10-CM | POA: Diagnosis not present

## 2024-01-25 DIAGNOSIS — I700 Atherosclerosis of aorta: Secondary | ICD-10-CM | POA: Diagnosis not present

## 2024-01-25 DIAGNOSIS — I10 Essential (primary) hypertension: Secondary | ICD-10-CM | POA: Diagnosis not present

## 2024-01-25 DIAGNOSIS — N182 Chronic kidney disease, stage 2 (mild): Secondary | ICD-10-CM | POA: Diagnosis not present

## 2024-02-14 DIAGNOSIS — R7303 Prediabetes: Secondary | ICD-10-CM | POA: Diagnosis not present

## 2024-02-14 DIAGNOSIS — E782 Mixed hyperlipidemia: Secondary | ICD-10-CM | POA: Diagnosis not present

## 2024-02-14 DIAGNOSIS — I700 Atherosclerosis of aorta: Secondary | ICD-10-CM | POA: Diagnosis not present

## 2024-02-14 DIAGNOSIS — I10 Essential (primary) hypertension: Secondary | ICD-10-CM | POA: Diagnosis not present

## 2024-02-14 DIAGNOSIS — N182 Chronic kidney disease, stage 2 (mild): Secondary | ICD-10-CM | POA: Diagnosis not present

## 2024-02-19 DIAGNOSIS — H34832 Tributary (branch) retinal vein occlusion, left eye, with macular edema: Secondary | ICD-10-CM | POA: Diagnosis not present

## 2024-02-19 DIAGNOSIS — H35351 Cystoid macular degeneration, right eye: Secondary | ICD-10-CM | POA: Diagnosis not present

## 2024-03-08 DIAGNOSIS — R339 Retention of urine, unspecified: Secondary | ICD-10-CM | POA: Diagnosis not present

## 2024-04-08 DIAGNOSIS — L814 Other melanin hyperpigmentation: Secondary | ICD-10-CM | POA: Diagnosis not present

## 2024-04-08 DIAGNOSIS — L821 Other seborrheic keratosis: Secondary | ICD-10-CM | POA: Diagnosis not present

## 2024-04-08 DIAGNOSIS — L578 Other skin changes due to chronic exposure to nonionizing radiation: Secondary | ICD-10-CM | POA: Diagnosis not present

## 2024-04-08 DIAGNOSIS — R339 Retention of urine, unspecified: Secondary | ICD-10-CM | POA: Diagnosis not present

## 2024-04-08 DIAGNOSIS — D225 Melanocytic nevi of trunk: Secondary | ICD-10-CM | POA: Diagnosis not present

## 2024-05-08 DIAGNOSIS — R339 Retention of urine, unspecified: Secondary | ICD-10-CM | POA: Diagnosis not present

## 2024-05-08 DIAGNOSIS — H34832 Tributary (branch) retinal vein occlusion, left eye, with macular edema: Secondary | ICD-10-CM | POA: Diagnosis not present

## 2024-06-11 DIAGNOSIS — R972 Elevated prostate specific antigen [PSA]: Secondary | ICD-10-CM | POA: Diagnosis not present

## 2024-06-11 DIAGNOSIS — R351 Nocturia: Secondary | ICD-10-CM | POA: Diagnosis not present

## 2024-06-11 DIAGNOSIS — N401 Enlarged prostate with lower urinary tract symptoms: Secondary | ICD-10-CM | POA: Diagnosis not present

## 2024-06-11 DIAGNOSIS — R3912 Poor urinary stream: Secondary | ICD-10-CM | POA: Diagnosis not present

## 2024-06-11 DIAGNOSIS — R350 Frequency of micturition: Secondary | ICD-10-CM | POA: Diagnosis not present
